# Patient Record
Sex: Male | Born: 1970 | Race: White | Hispanic: No | Marital: Married | Smoking: Former smoker
Health system: Southern US, Community
[De-identification: ages and names within clinical notes are randomized; demographics above are authoritative.]

## PROBLEM LIST (undated history)

## (undated) DIAGNOSIS — I10 Essential (primary) hypertension: Secondary | ICD-10-CM

## (undated) DIAGNOSIS — F419 Anxiety disorder, unspecified: Secondary | ICD-10-CM

## (undated) DIAGNOSIS — E785 Hyperlipidemia, unspecified: Secondary | ICD-10-CM

## (undated) DIAGNOSIS — K219 Gastro-esophageal reflux disease without esophagitis: Secondary | ICD-10-CM

## (undated) HISTORY — DX: Gastro-esophageal reflux disease without esophagitis: K21.9

## (undated) HISTORY — PX: KNEE ARTHROSCOPY: SHX127

## (undated) HISTORY — DX: Essential (primary) hypertension: I10

## (undated) HISTORY — DX: Anxiety disorder, unspecified: F41.9

## (undated) HISTORY — DX: Hyperlipidemia, unspecified: E78.5

---

## 2016-10-29 DIAGNOSIS — R079 Chest pain, unspecified: Secondary | ICD-10-CM | POA: Diagnosis not present

## 2016-10-29 DIAGNOSIS — K219 Gastro-esophageal reflux disease without esophagitis: Secondary | ICD-10-CM | POA: Diagnosis not present

## 2016-10-30 DIAGNOSIS — K219 Gastro-esophageal reflux disease without esophagitis: Secondary | ICD-10-CM | POA: Diagnosis not present

## 2016-10-30 DIAGNOSIS — R079 Chest pain, unspecified: Secondary | ICD-10-CM | POA: Diagnosis not present

## 2019-04-01 DIAGNOSIS — Z01818 Encounter for other preprocedural examination: Secondary | ICD-10-CM

## 2019-09-22 DIAGNOSIS — R079 Chest pain, unspecified: Secondary | ICD-10-CM

## 2019-09-22 DIAGNOSIS — I10 Essential (primary) hypertension: Secondary | ICD-10-CM

## 2019-09-24 ENCOUNTER — Encounter: Payer: Self-pay | Admitting: Cardiology

## 2019-09-28 ENCOUNTER — Encounter: Payer: Self-pay | Admitting: Cardiology

## 2019-09-28 NOTE — Progress Notes (Signed)
Cardiology Office Note:    Date:  09/29/2019   ID:  Benjamin Ion Housholder Sr., DOB [birth year], MRN [id number]  PCP:  [place]  Cardiologist:  Shirlee More, MD   Referring MD: No ref. provider found  ASSESSMENT:    1. Chest pain of uncertain etiology   2. Essential hypertension   3. Right bundle branch block    PLAN:    In order of problems listed above:  1. Atypical symptoms but increased cardiovascular risk cardiac CTA is ordered. 2. Poorly controlled switch to a more potent ARB thiazide combination 3. Hyperlipidemia stable continue statin 4. Stable EKG pattern hospital records  Next appointment 6 weeks   Medication Adjustments/Labs and Tests Ordered: Current medicines are reviewed at length with the patient today.  Concerns regarding medicines are outlined above.  No orders of the defined types were placed in this encounter.  No orders of the defined types were placed in this encounter.    Chief Complaint  Patient presents with  . Hypertension  . Chest Pain    History of Present Illness:    Benjamin Schlarb Moening Sr. is a 49 y.o. male who is being seen today for the evaluation of high blood pressure at the request of [place] ED provider Dr. Lanny Hurst he has a background history of hypertension and no known heart disease.  Other medical problems include hyperlipidemia gastroesophageal reflux disease anxiety and GERD.  The ED record his CBC was normal hemoglobin 15.5 creatinine 0.7 normal GFR potassium 3.7.  The EKG showed sinus rhythm was felt to be normal and a chest x-ray was normal.  He was discharged from the emergency room on low-dose losartan 25 mg daily.  He was admitted to [facility] on 09/22/19 discharged within 24 hours with chest pain  [name] NP is his primary care physician.  His EKG did not show acute ischemia and his troponins were normal and the patient declined having an ischemia evaluation and did not have a myocardial  perfusion study.  He had 3 troponins that were all undetectable.  In the emergency room at [facility] on 08/22/2018 with chest pain months felt to be low risk with a normal troponin discharged. I independently reviewed the EKG 09/22/2019 09/24/2019 both show sinus rhythm incomplete right bundle branch block left atrial enlargement no ischemic changes  I have cared for multiple family members has a family history of premature CAD.  He smokes has hyperlipidemia on a statin and hypertension also.  He has had knee pain degenerative joint disease and associated with steroids at the onset of hyper tension he has had several ED visits.  He has had chest discomfort but it is nonexertional is associated with anxiety and often relieved with belching.  He has mild exertional shortness of breath he attributes to weight gain 20 pounds cigarette smoking and sedentary lifestyle and wants to start an exercise program.  He also has fatigue was diagnosed with testosterone deficiency but has not started supplements.  His blood pressures are erratic ranging from 120-130/70-80 to 170/100 and checks blood pressure variable times of the day.  Further evaluation will undergo a cardiac CTA to define his calcium score presence or absence CAD and if he has high risk anatomy would benefit from coronary angiography and revascularization.  I strongly encouraged him to stop smoking.  I encouraged him to begin an exercise program he has equipment he has never used at home treadmill elliptical and to undergo cardiac CTA for  risk assessment.  His hypertension is poorly controlled we will switch to a more potent ARB thiazide combination check at rest record and follow-up with me in 6 weeks.  Continue his lipid-lowering therapy. Past Medical History:  Diagnosis Date  . Anxiety   . GERD (gastroesophageal reflux disease)   . Hyperlipidemia   . Hypertension     History reviewed. No pertinent surgical history.  Current  Medications: Current Meds  Medication Sig  . atorvastatin (LIPITOR) 40 MG tablet Take 40 mg by mouth daily.  . montelukast (SINGULAIR) 10 MG tablet Take 10 mg by mouth daily.  . pantoprazole (PROTONIX) 40 MG tablet Take 40 mg by mouth daily.  . [DISCONTINUED] losartan (COZAAR) 50 MG tablet Take 50 mg by mouth daily.     Allergies:   Patient has no allergy information on record.   Social History   Socioeconomic History  . Marital status: Married    Spouse name: Not on file  . Number of children: Not on file  . Years of education: Not on file  . Highest education level: Not on file  Occupational History  . Not on file  Tobacco Use  . Smoking status: Current Every Day Smoker  . Smokeless tobacco: Never Used  Substance and Sexual Activity  . Alcohol use: Not on file  . Drug use: Not on file  . Sexual activity: Not on file  Other Topics Concern  . Not on file  Social History Narrative  . Not on file   Social Determinants of Health   Financial Resource Strain:   . Difficulty of Paying Living Expenses:   Food Insecurity:   . Worried About Programme researcher, broadcasting/film/video in the Last Year:   . Barista in the Last Year:   Transportation Needs:   . Freight forwarder (Medical):   Marland Kitchen Lack of Transportation (Non-Medical):   Physical Activity:   . Days of Exercise per Week:   . Minutes of Exercise per Session:   Stress:   . Feeling of Stress :   Social Connections:   . Frequency of Communication with Friends and Family:   . Frequency of Social Gatherings with Friends and Family:   . Attends Religious Services:   . Active Member of Clubs or Organizations:   . Attends Banker Meetings:   Marland Kitchen Marital Status:      Family History: The patient's family history is not on file.  ROS:   Review of Systems  Constitution: Positive for weight gain.  HENT: Negative.   Eyes: Negative.   Cardiovascular: Positive for chest pain and dyspnea on exertion.  Respiratory:  Positive for shortness of breath.   Endocrine: Negative.   Hematologic/Lymphatic: Negative.   Skin: Negative.   Musculoskeletal: Positive for joint pain.  Gastrointestinal: Positive for bloating.  Genitourinary: Negative.   Neurological: Negative.   Psychiatric/Behavioral: Negative.   Allergic/Immunologic: Negative.    Please see the history of present illness.     All other systems reviewed and are negative.  EKGs/Labs/Other Studies Reviewed:    The following studies were reviewed today:   EKG:  EKG is  ordered today.  The ekg ordered today is personally reviewed and demonstrates sinus rhythm normal    Physical Exam:    VS:  BP 124/70   Pulse 74   Ht 5\' 10"  (1.778 m)   Wt 235 lb (106.6 kg)   SpO2 97%   BMI 33.72 kg/m     Wt Readings  from Last 3 Encounters:  09/29/19 235 lb (106.6 kg)     GEN:  Well nourished, well developed in no acute distress HEENT: Normal NECK: No JVD; No carotid bruits LYMPHATICS: No lymphadenopathy CARDIAC: RRR, no murmurs, rubs, gallops RESPIRATORY:  Clear to auscultation without rales, wheezing or rhonchi  ABDOMEN: Soft, non-tender, non-distended MUSCULOSKELETAL:  No edema; No deformity  SKIN: Warm and dry NEUROLOGIC:  Alert and oriented x 3 PSYCHIATRIC:  Normal affect     Signed, Norman Herrlich, MD  09/29/2019 3:27 PM    [facility]

## 2019-09-29 ENCOUNTER — Ambulatory Visit (INDEPENDENT_AMBULATORY_CARE_PROVIDER_SITE_OTHER): Payer: Self-pay | Admitting: Cardiology

## 2019-09-29 ENCOUNTER — Other Ambulatory Visit: Payer: Self-pay

## 2019-09-29 ENCOUNTER — Encounter: Payer: Self-pay | Admitting: Cardiology

## 2019-09-29 VITALS — BP 124/70 | HR 74 | Ht 70.0 in | Wt 235.0 lb

## 2019-09-29 DIAGNOSIS — R079 Chest pain, unspecified: Secondary | ICD-10-CM

## 2019-09-29 DIAGNOSIS — I10 Essential (primary) hypertension: Secondary | ICD-10-CM

## 2019-09-29 DIAGNOSIS — R072 Precordial pain: Secondary | ICD-10-CM

## 2019-09-29 DIAGNOSIS — I4510 Unspecified right bundle-branch block: Secondary | ICD-10-CM

## 2019-09-29 MED ORDER — METOPROLOL TARTRATE 100 MG PO TABS: 100.0000 mg | ORAL_TABLET | Freq: Once | ORAL | 0 refills | Status: DC

## 2019-09-29 MED ORDER — TELMISARTAN-HCTZ 40-12.5 MG PO TABS: 1.0000 | ORAL_TABLET | Freq: Every day | ORAL | 3 refills | Status: DC

## 2019-09-29 NOTE — Patient Instructions (Addendum)
Medication Instructions:  Your physician has recommended you make the following change in your medication:  STOP Losartan START Telmisartan-HCTZ 40-12.5 mg take one tablet by mouth daily. *If you need a refill on your cardiac medications before your next appointment, please call your pharmacy*   Lab Work: Your physician recommends that you return for lab work in: [place] CT BMP If you have labs (blood work) drawn today and your tests are completely normal, you will receive your results only by: Marland Kitchen MyChart Message (if you have MyChart) OR . A paper copy in the mail If you have any lab test that is abnormal or we need to change your treatment, we will call you to review the results.   Testing/Procedures: Your physician has requested that you have cardiac CT. Cardiac computed tomography (CT) is a painless test that uses an x-ray machine to take clear, detailed pictures of your heart. For further information please visit [email or web address]. Please follow instruction sheet as given.  Your cardiac CT will be scheduled at one of the below locations:   [facility] [address] [place], [place] [address] [phone]   If scheduled at [facility], please arrive at the [facility] main entrance of [facility] 30 minutes prior to test start time. Proceed to the [facility] Radiology Department (first floor) to check-in and test prep.  Please follow these instructions carefully (unless otherwise directed):  On the Night Before the Test: . Be sure to Drink plenty of water. . Do not consume any caffeinated/decaffeinated beverages or chocolate 12 hours prior to your test. . Do not take any antihistamines 12 hours prior to your test.   On the Day of the Test: . Drink plenty of water. Do not drink any water within one hour of the test. . Do not eat any food 4 hours prior to the test. . You may take your regular medications prior to the test.  . Take  metoprolol (Lopressor) two hours prior to test. . HOLD Hydrochlorothiazide morning of the test.              After the Test: . Drink plenty of water. . After receiving IV contrast, you may experience a mild flushed feeling. This is normal. . On occasion, you may experience a mild rash up to 24 hours after the test. This is not dangerous. If this occurs, you can take Benadryl 25 mg and increase your fluid intake. . If you experience trouble breathing, this can be serious. If it is severe call 911 IMMEDIATELY. If it is mild, please call our office.  Once we have confirmed authorization from your insurance company, we will call you to set up a date and time for your test.   For non-scheduling related questions, please contact the cardiac imaging nurse navigator should you have any questions/concerns: Marchia Bond, RN Navigator Cardiac Imaging [facility] [phone] office  For scheduling needs, including cancellations and rescheduling, please call [phone].      Follow-Up: At [facility], you and your health needs are our priority.  As part of our continuing mission to provide you with exceptional heart care, we have created designated Provider Care Teams.  These Care Teams include your primary Cardiologist (physician) and Advanced Practice Providers (APPs -  Physician Assistants and Nurse Practitioners) who all work together to provide you with the care you need, when you need it.  We recommend signing up for the patient portal called "  MyChart".  Sign up information is provided on this After Visit Summary.  MyChart is used to connect with patients for Virtual Visits (Telemedicine).  Patients are able to view lab/test results, encounter notes, upcoming appointments, etc.  Non-urgent messages can be sent to your provider as well.   To learn more about what you can do with MyChart, go to [email or web address].    Your next appointment:   6  week(s)  The format for your next appointment:   In Person  Provider:   Norman Herrlich, MD   Other Instructions

## 2019-10-26 ENCOUNTER — Telehealth (HOSPITAL_COMMUNITY): Payer: Self-pay | Admitting: Emergency Medicine

## 2019-10-26 ENCOUNTER — Other Ambulatory Visit: Payer: Self-pay

## 2019-10-26 DIAGNOSIS — R079 Chest pain, unspecified: Secondary | ICD-10-CM

## 2019-10-26 DIAGNOSIS — I10 Essential (primary) hypertension: Secondary | ICD-10-CM

## 2019-10-26 NOTE — Telephone Encounter (Signed)
Reaching out to patient to offer assistance regarding upcoming cardiac imaging study; pt verbalizes understanding of appt date/time, parking situation and where to check in, pre-test NPO status and medications ordered, and verified current allergies; name and call back number provided for further questions should they arise Gresia Isidoro RN Navigator Cardiac Imaging [facility] [phone] office [phone] cell 

## 2019-10-27 ENCOUNTER — Ambulatory Visit (HOSPITAL_COMMUNITY)
Admission: RE | Admit: 2019-10-27 | Discharge: 2019-10-27 | Disposition: A | Discharge status: Home / Self Care | Payer: Self-pay | Source: Ambulatory Visit | Attending: Cardiology | Admitting: Cardiology

## 2019-10-27 ENCOUNTER — Encounter: Payer: Self-pay | Admitting: *Deleted

## 2019-10-27 ENCOUNTER — Other Ambulatory Visit: Payer: Self-pay

## 2019-10-27 DIAGNOSIS — Z006 Encounter for examination for normal comparison and control in clinical research program: Secondary | ICD-10-CM

## 2019-10-27 DIAGNOSIS — R072 Precordial pain: Secondary | ICD-10-CM

## 2019-10-27 LAB — BASIC METABOLIC PANEL
BUN/Creatinine Ratio: 22 — ABNORMAL HIGH (ref 9–20)
BUN: 20 mg/dL (ref 6–24)
CO2: 22 mmol/L (ref 20–29)
Calcium: 9.6 mg/dL (ref 8.7–10.2)
Chloride: 99 mmol/L (ref 96–106)
Creatinine, Ser: 0.93 mg/dL (ref 0.76–1.27)
GFR calc Af Amer: 111 mL/min/{1.73_m2} (ref >59)
GFR calc non Af Amer: 96 mL/min/{1.73_m2} (ref >59)
Glucose: 129 mg/dL — ABNORMAL HIGH (ref 65–99)
Potassium: 3.8 mmol/L (ref 3.5–5.2)
Sodium: 138 mmol/L (ref 134–144)

## 2019-10-27 MED ORDER — NITROGLYCERIN 0.4 MG SL SUBL
SUBLINGUAL_TABLET | SUBLINGUAL | Status: AC
  Administered 2019-10-27: 17:00:00 0.8 mg via SUBLINGUAL
  Filled 2019-10-27: qty 2

## 2019-10-27 MED ORDER — IOHEXOL 350 MG/ML SOLN
100.0000 mL | Freq: Once | INTRAVENOUS | Status: AC | PRN
  Administered 2019-10-27: 100 mL via INTRAVENOUS

## 2019-10-27 MED ORDER — NITROGLYCERIN 0.4 MG SL SUBL: 0.8000 mg | SUBLINGUAL_TABLET | Freq: Once | SUBLINGUAL | Status: AC

## 2019-10-27 NOTE — Research (Signed)
CADFEM Informed Consent                  Subject Name:   Benjamin Douglas   Subject met inclusion and exclusion criteria.  The informed consent form, study requirements and expectations were reviewed with the subject and questions and concerns were addressed prior to the signing of the consent form.  The subject verbalized understanding of the trial requirements.  The subject agreed to participate in the CADFEM trial and signed the informed consent.  The informed consent was obtained prior to performance of any protocol-specific procedures for the subject.  A copy of the signed informed consent was given to the subject and a copy was placed in the subject's medical record.   [place] Kadelyn Dimascio, Research Assistant   10/27/2019  15:20

## 2019-10-31 ENCOUNTER — Telehealth: Payer: Self-pay

## 2019-10-31 NOTE — Telephone Encounter (Signed)
Spoke with patient regarding results and recommendation.  Patient verbalizes understanding and is agreeable to plan of care. Advised patient to call back with any issues or concerns.  

## 2019-10-31 NOTE — Telephone Encounter (Signed)
-----   Message from Baldo Daub, MD sent at 10/29/2019  4:23 PM EDT ----- Normal or stable result   Overall good result, his calcium score a measure of total atherosclerosis was quite low and he had very mild atherosclerosis in the coronary arteries with no concerning blockage.  Please be sure he has a follow-up appointment to see me in the office after this.

## 2019-11-09 DIAGNOSIS — Z720 Tobacco use: Secondary | ICD-10-CM

## 2019-11-09 DIAGNOSIS — K219 Gastro-esophageal reflux disease without esophagitis: Secondary | ICD-10-CM | POA: Insufficient documentation

## 2019-11-09 DIAGNOSIS — I249 Acute ischemic heart disease, unspecified: Secondary | ICD-10-CM

## 2019-11-09 DIAGNOSIS — R079 Chest pain, unspecified: Secondary | ICD-10-CM

## 2019-11-09 DIAGNOSIS — I10 Essential (primary) hypertension: Secondary | ICD-10-CM

## 2019-11-09 HISTORY — DX: Essential (primary) hypertension: I10

## 2019-11-09 HISTORY — DX: Chest pain, unspecified: R07.9

## 2019-11-09 HISTORY — DX: Acute ischemic heart disease, unspecified: I24.9

## 2019-11-09 HISTORY — DX: Tobacco use: Z72.0

## 2019-11-09 NOTE — Progress Notes (Signed)
Cardiology Office Note:    Date:  11/10/2019   ID:  [name], DOB 1970/07/22, MRN [id number]  PCP:  [place]  Cardiologist:  Shirlee More, MD    Referring MD: [facility]*    ASSESSMENT:    1. Mild CAD   2. Hyperlipidemia, unspecified hyperlipidemia type   3. Essential hypertension   4. Right bundle branch block    PLAN:    In order of problems listed above:  1. CT reviewed with patient he has coronary atherosclerosis less than 24% stenosis LAD medical therapy aspirin antihypertensive intensity statin check his lipid profile goal of LDL is less than 70 ideally less than 50 may require the addition of Zetia.  We will also screen for LP(a) access today. 2. Continue statin 3. BP at target continue his current antihypertensive 4. Stable EKG pattern   Next appointment: 6 months   Medication Adjustments/Labs and Tests Ordered: Current medicines are reviewed at length with the patient today.  Concerns regarding medicines are outlined above.  Orders Placed This Encounter  Procedures  . Lipid Profile  . Lipoprotein A (LPA)  . Comp Met (CMET)  . EKG 12-Lead   Meds ordered this encounter  Medications  . tadalafil (CIALIS) 5 MG tablet    Sig: Take 1 tablet (5 mg total) by mouth daily as needed for erectile dysfunction.    Dispense:  10 tablet    Refill:  0    No chief complaint on file.   History of Present Illness:    Benjamin Jayson Boyd Sr. is a 49 y.o. male with a hx of chest pain, family history premature CAD, RBBB and hypertension last seen 09/29/2019. Compliance with diet, lifestyle and medications: Yes  Reviewed and reviewed his cardiac CTA with calcium score as well as 67 percentile as well as mild nonobstructive CAD.  With his family history consistent with coronary his attention he is on a statin asked me to check a lipid profile will check LP(a) with a strong family history BP is at target and he is having no angina.   Inquires about testosterone therapy: Lipids are good idea and when asked about erectile dysfunction I think it safe for him to use Cialis and gave a prescription.  He is having no muscle pain or weakness from statin his blood pressure at home is at target and he said no further chest pain.  He is quite motivated about diet exercise and weight loss.  Cardiac CTA 10/27/2019: IMPRESSION: 1. Calcium score 5 which is 71 th percentile for age and sex 2.  Normal aortic root diameter 3.1 cm 3.  CAD RADS 1 non obstructive CAD  Past Medical History:  Diagnosis Date  . Acute coronary syndrome ([place]) 11/09/2019  . Anxiety   . Chest pain 11/09/2019  . GERD (gastroesophageal reflux disease)   . Hyperlipidemia   . Hypertension   . Hypertension, essential 11/09/2019  . Tobacco abuse 11/09/2019    History reviewed. No pertinent surgical history.  Current Medications: Current Meds  Medication Sig  . atorvastatin (LIPITOR) 40 MG tablet Take 40 mg by mouth daily.  . montelukast (SINGULAIR) 10 MG tablet Take 10 mg by mouth daily.  . pantoprazole (PROTONIX) 40 MG tablet Take 40 mg by mouth daily.  Marland Kitchen telmisartan-hydrochlorothiazide (MICARDIS HCT) 40-12.5 MG tablet Take 1 tablet by mouth daily.     Allergies:   Patient has no allergy information on record.   Social History   Socioeconomic  History  . Marital status: Married    Spouse name: Not on file  . Number of children: Not on file  . Years of education: Not on file  . Highest education level: Not on file  Occupational History  . Not on file  Tobacco Use  . Smoking status: Current Every Day Smoker  . Smokeless tobacco: Never Used  Substance and Sexual Activity  . Alcohol use: Not on file  . Drug use: Not on file  . Sexual activity: Not on file  Other Topics Concern  . Not on file  Social History Narrative  . Not on file   Social Determinants of Health   Financial Resource Strain:   . Difficulty of Paying Living Expenses:   Food  Insecurity:   . Worried About Charity fundraiser in the Last Year:   . Arboriculturist in the Last Year:   Transportation Needs:   . Film/video editor (Medical):   Marland Kitchen Lack of Transportation (Non-Medical):   Physical Activity:   . Days of Exercise per Week:   . Minutes of Exercise per Session:   Stress:   . Feeling of Stress :   Social Connections:   . Frequency of Communication with Friends and Family:   . Frequency of Social Gatherings with Friends and Family:   . Attends Religious Services:   . Active Member of Clubs or Organizations:   . Attends Archivist Meetings:   Marland Kitchen Marital Status:      Family History: The patient's family history is not on file. ROS:   Please see the history of present illness.    All other systems reviewed and are negative.  EKGs/Labs/Other Studies Reviewed:    The following studies were reviewed today:  EKG:  EKG ordered today and personally reviewed.  The ekg ordered today demonstrates sinus rhythm and remains normal except incomplete right bundle branch block  Recent Labs: 10/26/2019: BUN 20; Creatinine, Ser 0.93; Potassium 3.8; Sodium 138  Recent Lipid Panel No results found for: CHOL, TRIG, HDL, CHOLHDL, VLDL, LDLCALC, LDLDIRECT  Physical Exam:    VS:  BP 124/76   Pulse 77   Temp 98.6 F (37 C)   Ht 5' 10"  (1.778 m)   Wt 233 lb 3.2 oz (105.8 kg)   SpO2 97%   BMI 33.46 kg/m     Wt Readings from Last 3 Encounters:  11/10/19 233 lb 3.2 oz (105.8 kg)  09/29/19 235 lb (106.6 kg)     GEN:  Well nourished, well developed in no acute distress he has no xanthoma or xanthelasma HEENT: Normal NECK: No JVD; No carotid bruits LYMPHATICS: No lymphadenopathy CARDIAC: RRR, no murmurs, rubs, gallops RESPIRATORY:  Clear to auscultation without rales, wheezing or rhonchi  ABDOMEN: Soft, non-tender, non-distended MUSCULOSKELETAL:  No edema; No deformity  SKIN: Warm and dry NEUROLOGIC:  Alert and oriented x 3 PSYCHIATRIC:  Normal  affect    Signed, Shirlee More, MD  11/10/2019 3:19 PM    [facility]

## 2019-11-10 ENCOUNTER — Other Ambulatory Visit: Payer: Self-pay

## 2019-11-10 ENCOUNTER — Encounter: Payer: Self-pay | Admitting: Cardiology

## 2019-11-10 ENCOUNTER — Ambulatory Visit (INDEPENDENT_AMBULATORY_CARE_PROVIDER_SITE_OTHER): Payer: Self-pay | Admitting: Cardiology

## 2019-11-10 VITALS — BP 124/76 | HR 77 | Temp 98.6°F | Ht 70.0 in | Wt 233.2 lb

## 2019-11-10 DIAGNOSIS — I2510 Atherosclerotic heart disease of native coronary artery without angina pectoris: Secondary | ICD-10-CM

## 2019-11-10 DIAGNOSIS — I10 Essential (primary) hypertension: Secondary | ICD-10-CM

## 2019-11-10 DIAGNOSIS — E785 Hyperlipidemia, unspecified: Secondary | ICD-10-CM

## 2019-11-10 DIAGNOSIS — I4510 Unspecified right bundle-branch block: Secondary | ICD-10-CM

## 2019-11-10 MED ORDER — TADALAFIL 5 MG PO TABS: 5.0000 mg | ORAL_TABLET | Freq: Every day | ORAL | 0 refills | Status: DC | PRN

## 2019-11-10 NOTE — Patient Instructions (Signed)
Medication Instructions:  Your physician has recommended you make the following change in your medication:  START: Cialis 5 mg take one tablet by mouth daily as needed.  *If you need a refill on your cardiac medications before your next appointment, please call your pharmacy*   Lab Work: Your physician recommends that you return for lab work in: TODAY Lipids, Lpa, CMP If you have labs (blood work) drawn today and your tests are completely normal, you will receive your results only by: Marland Kitchen MyChart Message (if you have MyChart) OR . A paper copy in the mail If you have any lab test that is abnormal or we need to change your treatment, we will call you to review the results.   Testing/Procedures: None   Follow-Up: At [facility], you and your health needs are our priority.  As part of our continuing mission to provide you with exceptional heart care, we have created designated Provider Care Teams.  These Care Teams include your primary Cardiologist (physician) and Advanced Practice Providers (APPs -  Physician Assistants and Nurse Practitioners) who all work together to provide you with the care you need, when you need it.  We recommend signing up for the patient portal called "MyChart".  Sign up information is provided on this After Visit Summary.  MyChart is used to connect with patients for Virtual Visits (Telemedicine).  Patients are able to view lab/test results, encounter notes, upcoming appointments, etc.  Non-urgent messages can be sent to your provider as well.   To learn more about what you can do with MyChart, go to [email or web address].    Your next appointment:   6 month(s)  The format for your next appointment:   In Person  Provider:   Norman Herrlich, MD   Other Instructions

## 2019-11-14 LAB — COMPREHENSIVE METABOLIC PANEL
ALT: 29 IU/L (ref 0–44)
AST: 24 IU/L (ref 0–40)
Albumin/Globulin Ratio: 2.4 — ABNORMAL HIGH (ref 1.2–2.2)
Albumin: 4.7 g/dL (ref 4.0–5.0)
Alkaline Phosphatase: 116 IU/L (ref 39–117)
BUN/Creatinine Ratio: 17 (ref 9–20)
BUN: 16 mg/dL (ref 6–24)
Bilirubin Total: 0.4 mg/dL (ref 0.0–1.2)
CO2: 23 mmol/L (ref 20–29)
Calcium: 9 mg/dL (ref 8.7–10.2)
Chloride: 104 mmol/L (ref 96–106)
Creatinine, Ser: 0.93 mg/dL (ref 0.76–1.27)
GFR calc Af Amer: 111 mL/min/{1.73_m2} (ref >59)
GFR calc non Af Amer: 96 mL/min/{1.73_m2} (ref >59)
Globulin, Total: 2 g/dL (ref 1.5–4.5)
Glucose: 120 mg/dL — ABNORMAL HIGH (ref 65–99)
Potassium: 4 mmol/L (ref 3.5–5.2)
Sodium: 140 mmol/L (ref 134–144)
Total Protein: 6.7 g/dL (ref 6.0–8.5)

## 2019-11-14 LAB — LIPOPROTEIN A (LPA): Lipoprotein (a): 10 nmol/L (ref <75.0)

## 2019-11-14 LAB — LIPID PANEL
Chol/HDL Ratio: 5.9 ratio — ABNORMAL HIGH (ref 0.0–5.0)
Cholesterol, Total: 182 mg/dL (ref 100–199)
HDL: 31 mg/dL — ABNORMAL LOW (ref >39)
LDL Chol Calc (NIH): 97 mg/dL (ref 0–99)
Triglycerides: 319 mg/dL — ABNORMAL HIGH (ref 0–149)
VLDL Cholesterol Cal: 54 mg/dL — ABNORMAL HIGH (ref 5–40)

## 2019-11-15 ENCOUNTER — Telehealth: Payer: Self-pay

## 2019-11-15 MED ORDER — EZETIMIBE 10 MG PO TABS: 10.0000 mg | ORAL_TABLET | Freq: Every day | ORAL | 3 refills | Status: DC

## 2019-11-15 NOTE — Telephone Encounter (Signed)
Spoke with patient regarding results and recommendation.  Patient verbalizes understanding and is agreeable to plan of care. Advised patient to call back with any issues or concerns.  

## 2019-11-15 NOTE — Telephone Encounter (Signed)
-----   Message from Thomasene Ripple, DO sent at 11/14/2019 10:30 PM EDT ----- Lipid profile showed dyslipidemia.  I see he is on Lipitor 40 mg daily.  Would like to add Zetia 10 mg daily to his regimen.  Also encouraged the patient to decrease carbs in diet.  We will continue to monitor repeat blood work as appropriate

## 2020-01-27 ENCOUNTER — Other Ambulatory Visit: Payer: Self-pay | Admitting: Cardiology

## 2020-02-22 ENCOUNTER — Telehealth: Payer: Self-pay | Admitting: Cardiology

## 2020-02-22 ENCOUNTER — Other Ambulatory Visit: Payer: Self-pay

## 2020-02-22 DIAGNOSIS — Z79899 Other long term (current) drug therapy: Secondary | ICD-10-CM

## 2020-02-22 NOTE — Progress Notes (Signed)
Generally I prefer not to would direct him to his PCP

## 2020-02-22 NOTE — Telephone Encounter (Signed)
The week before his visit with me he should have a CMP and a lipid profile performed  I reviewed his chart I think he could take Viagra but has to understand he cannot take nitroglycerin within 24 hours of taking a tablet.

## 2020-02-22 NOTE — Progress Notes (Signed)
Baldo Daub, MD     02/22/20 2:05 PM Note The week before his visit with me he should have a CMP and a lipid profile performed  I reviewed his chart I think he could take Viagra but has to understand he cannot take nitroglycerin within 24 hours of taking a tablet.     Called the patient and made him aware of the above recommendations from Dr. Dulce Sellar. Lab orders placed. Patient is now asking if Dr. Dulce Sellar will send in a Rx for the requested Viagra. I let the patient know that I would check with Dr. Dulce Sellar to see if he would like to prescribe.

## 2020-02-22 NOTE — Telephone Encounter (Signed)
Baldo Daub, MD    02/22/20 2:05 PM Note The week before his visit with me he should have a CMP and a lipid profile performed  I reviewed his chart I think he could take Viagra but has to understand he cannot take nitroglycerin within 24 hours of taking a tablet.     Called the patient and made him aware of the above recommendations from Dr. Dulce Sellar. Lab orders placed. Patient is now asking if Dr. Dulce Sellar will send in a Rx for the requested Viagra. I let the patient know that I would check with Dr. Dulce Sellar to see if he would like to prescribe.   Called the patient back to let him know that Dr. Dulce Sellar prefers that patient reach out to his PCP to request the Viagra Rx as he does not typically prescribe those.

## 2020-02-22 NOTE — Telephone Encounter (Signed)
    Pt c/o medication issue:  1. Name of Medication: Viagra  2. How are you currently taking this medication (dosage and times per day)?   3. Are you having a reaction (difficulty breathing--STAT)?   4. What is your medication issue? Pt's wife called, she said pt wants to talk to Dr. Dulce Sellar about taking Viagra. She said to call opt back.  She also wanted to ask if Dr. Dulce Sellar wants pt to get blood work before seeing him in November

## 2020-05-08 ENCOUNTER — Ambulatory Visit: Payer: Self-pay | Admitting: Cardiology

## 2020-05-17 NOTE — Progress Notes (Signed)
Cardiology Office Note:    Date:  05/18/2020   ID:  Benjamin Goo Maves Sr., DOB April 17, 1971, MRN [id number]  PCP:  [facility]  Cardiologist:  Benjamin Herrlich, MD    Referring MD: [facility]    ASSESSMENT:    1. Hypertension, essential   2. Mild CAD   3. Hyperlipidemia, unspecified hyperlipidemia type    PLAN:    In order of problems listed above:  1. Benjamin Douglas is doing well blood pressure at target continue his combination ARB thiazide diuretic well-tolerated check renal function and serum potassium I encouraged him to check his blood pressure at least weekly at home at rest and document 2. Stable asymptomatic having no angina present treatment continue his cholesterol and blood pressure medicines 3. Recheck lipids if not at target consider bempedoic acid or PCSK9 agent   Next appointment: 6 months   Medication Adjustments/Labs and Tests Ordered: Current medicines are reviewed at length with the patient today.  Concerns regarding medicines are outlined above.  Orders Placed This Encounter  Procedures  . Comprehensive metabolic panel  . Lipid panel   Meds ordered this encounter  Medications  . telmisartan-hydrochlorothiazide (MICARDIS HCT) 40-12.5 MG tablet    Sig: TAKE ONE (1) TABLET BY MOUTH ONCE DAILY    Dispense:  90 tablet    Refill:  3  . ezetimibe (ZETIA) 10 MG tablet    Sig: Take 1 tablet (10 mg total) by mouth daily.    Dispense:  90 tablet    Refill:  3  . atorvastatin (LIPITOR) 40 MG tablet    Sig: Take 1 tablet (40 mg total) by mouth daily.    Dispense:  90 tablet    Refill:  3    No chief complaint on file.   History of Present Illness:    Benjamin Goyne Skora Sr. is a 49 y.o. male with a hx of mild CAD right bundle branch block hypertension hyperlipidemia last seen 11/10/2019. Compliance with diet, lifestyle and medications: Yes  He is doing better was intolerant of statin with muscle and joint pain tolerates Zetia  check lipid profile.  Home blood pressures run in the range of 130-135/80.  He is having no angina dyspnea palpitation or syncope and is committed to taking care of himself Past Medical History:  Diagnosis Date  . Acute coronary syndrome (HCC) 11/09/2019  . Anxiety   . Chest pain 11/09/2019  . GERD (gastroesophageal reflux disease)   . Hyperlipidemia   . Hypertension   . Hypertension, essential 11/09/2019  . Tobacco abuse 11/09/2019    History reviewed. No pertinent surgical history.  Current Medications: Current Meds  Medication Sig  . atorvastatin (LIPITOR) 40 MG tablet Take 1 tablet (40 mg total) by mouth daily.  Marland Kitchen ezetimibe (ZETIA) 10 MG tablet Take 1 tablet (10 mg total) by mouth daily.  . montelukast (SINGULAIR) 10 MG tablet Take 10 mg by mouth daily.  . pantoprazole (PROTONIX) 40 MG tablet Take 40 mg by mouth daily.  . tadalafil (CIALIS) 5 MG tablet Take 1 tablet (5 mg total) by mouth daily as needed for erectile dysfunction.  Marland Kitchen telmisartan-hydrochlorothiazide (MICARDIS HCT) 40-12.5 MG tablet TAKE ONE (1) TABLET BY MOUTH ONCE DAILY  . [DISCONTINUED] atorvastatin (LIPITOR) 40 MG tablet Take 40 mg by mouth daily.  . [DISCONTINUED] ezetimibe (ZETIA) 10 MG tablet Take 1 tablet (10 mg total) by mouth daily.  . [DISCONTINUED] telmisartan-hydrochlorothiazide (MICARDIS HCT) 40-12.5 MG tablet TAKE ONE (1) TABLET BY MOUTH ONCE  DAILY     Allergies:   Patient has no known allergies.   Social History   Socioeconomic History  . Marital status: Married    Spouse name: Not on file  . Number of children: Not on file  . Years of education: Not on file  . Highest education level: Not on file  Occupational History  . Not on file  Tobacco Use  . Smoking status: Current Every Day Smoker  . Smokeless tobacco: Never Used  Substance and Sexual Activity  . Alcohol use: Not on file  . Drug use: Not on file  . Sexual activity: Not on file  Other Topics Concern  . Not on file  Social History  Narrative  . Not on file   Social Determinants of Health   Financial Resource Strain:   . Difficulty of Paying Living Expenses: Not on file  Food Insecurity:   . Worried About Programme researcher, broadcasting/film/video in the Last Year: Not on file  . Ran Out of Food in the Last Year: Not on file  Transportation Needs:   . Lack of Transportation (Medical): Not on file  . Lack of Transportation (Non-Medical): Not on file  Physical Activity:   . Days of Exercise per Week: Not on file  . Minutes of Exercise per Session: Not on file  Stress:   . Feeling of Stress : Not on file  Social Connections:   . Frequency of Communication with Friends and Family: Not on file  . Frequency of Social Gatherings with Friends and Family: Not on file  . Attends Religious Services: Not on file  . Active Member of Clubs or Organizations: Not on file  . Attends Banker Meetings: Not on file  . Marital Status: Not on file     Family History: The patient's family history is not on file. ROS:   Please see the history of present illness.    All other systems reviewed and are negative.  EKGs/Labs/Other Studies Reviewed:    The following studies were reviewed today:   Recent Labs: 11/10/2019: ALT 29; BUN 16; Creatinine, Ser 0.93; Potassium 4.0; Sodium 140  Recent Lipid Panel    Component Value Date/Time   CHOL 182 11/10/2019 1523   TRIG 319 (H) 11/10/2019 1523   HDL 31 (L) 11/10/2019 1523   CHOLHDL 5.9 (H) 11/10/2019 1523   LDLCALC 97 11/10/2019 1523    Physical Exam:    VS:  BP (!) 152/88   Pulse 72   Ht 5\' 11"  (1.803 m)   Wt 238 lb 6.4 oz (108.1 kg)   SpO2 98%   BMI 33.25 kg/m     Wt Readings from Last 3 Encounters:  05/18/20 238 lb 6.4 oz (108.1 kg)  11/10/19 233 lb 3.2 oz (105.8 kg)  09/29/19 235 lb (106.6 kg)     GEN:  Well nourished, well developed in no acute distress HEENT: Normal NECK: No JVD; No carotid bruits LYMPHATICS: No lymphadenopathy CARDIAC: RRR, no murmurs, rubs,  gallops RESPIRATORY:  Clear to auscultation without rales, wheezing or rhonchi  ABDOMEN: Soft, non-tender, non-distended MUSCULOSKELETAL:  No edema; No deformity  SKIN: Warm and dry NEUROLOGIC:  Alert and oriented x 3 PSYCHIATRIC:  Normal affect    Signed, 10/01/19, MD  05/18/2020 11:08 AM    [facility]

## 2020-05-18 ENCOUNTER — Encounter: Payer: Self-pay | Admitting: Cardiology

## 2020-05-18 ENCOUNTER — Ambulatory Visit (INDEPENDENT_AMBULATORY_CARE_PROVIDER_SITE_OTHER): Payer: Self-pay | Admitting: Cardiology

## 2020-05-18 ENCOUNTER — Other Ambulatory Visit: Payer: Self-pay

## 2020-05-18 VITALS — BP 152/88 | HR 72 | Ht 71.0 in | Wt 238.4 lb

## 2020-05-18 DIAGNOSIS — I10 Essential (primary) hypertension: Secondary | ICD-10-CM

## 2020-05-18 DIAGNOSIS — E785 Hyperlipidemia, unspecified: Secondary | ICD-10-CM

## 2020-05-18 DIAGNOSIS — I2510 Atherosclerotic heart disease of native coronary artery without angina pectoris: Secondary | ICD-10-CM

## 2020-05-18 LAB — LIPID PANEL
Chol/HDL Ratio: 4.1 ratio (ref 0.0–5.0)
Cholesterol, Total: 154 mg/dL (ref 100–199)
HDL: 38 mg/dL — ABNORMAL LOW (ref >39)
LDL Chol Calc (NIH): 91 mg/dL (ref 0–99)
Triglycerides: 141 mg/dL (ref 0–149)
VLDL Cholesterol Cal: 25 mg/dL (ref 5–40)

## 2020-05-18 LAB — COMPREHENSIVE METABOLIC PANEL
ALT: 25 IU/L (ref 0–44)
AST: 19 IU/L (ref 0–40)
Albumin/Globulin Ratio: 1.8 (ref 1.2–2.2)
Albumin: 4.5 g/dL (ref 4.0–5.0)
Alkaline Phosphatase: 104 IU/L (ref 44–121)
BUN/Creatinine Ratio: 21 — ABNORMAL HIGH (ref 9–20)
BUN: 16 mg/dL (ref 6–24)
Bilirubin Total: 0.4 mg/dL (ref 0.0–1.2)
CO2: 27 mmol/L (ref 20–29)
Calcium: 9.4 mg/dL (ref 8.7–10.2)
Chloride: 101 mmol/L (ref 96–106)
Creatinine, Ser: 0.75 mg/dL — ABNORMAL LOW (ref 0.76–1.27)
GFR calc Af Amer: 125 mL/min/{1.73_m2} (ref >59)
GFR calc non Af Amer: 108 mL/min/{1.73_m2} (ref >59)
Globulin, Total: 2.5 g/dL (ref 1.5–4.5)
Glucose: 108 mg/dL — ABNORMAL HIGH (ref 65–99)
Potassium: 4.6 mmol/L (ref 3.5–5.2)
Sodium: 137 mmol/L (ref 134–144)
Total Protein: 7 g/dL (ref 6.0–8.5)

## 2020-05-18 MED ORDER — ATORVASTATIN CALCIUM 40 MG PO TABS: 40.0000 mg | ORAL_TABLET | Freq: Every day | ORAL | 3 refills | Status: DC

## 2020-05-18 MED ORDER — EZETIMIBE 10 MG PO TABS: 10.0000 mg | ORAL_TABLET | Freq: Every day | ORAL | 3 refills | Status: DC

## 2020-05-18 MED ORDER — TELMISARTAN-HCTZ 40-12.5 MG PO TABS: ORAL_TABLET | ORAL | 3 refills | Status: DC

## 2020-05-18 NOTE — Patient Instructions (Signed)

## 2020-05-21 ENCOUNTER — Telehealth: Payer: Self-pay

## 2020-05-21 NOTE — Telephone Encounter (Signed)
Spoke with patient regarding results and recommendation.  Patient verbalizes understanding and is agreeable to plan of care. Advised patient to call back with any issues or concerns.  

## 2020-05-21 NOTE — Telephone Encounter (Signed)
-----   Message from Baldo Daub, MD sent at 05/20/2020 10:42 AM EST ----- Much improved  Lipids at target  No changes

## 2020-09-21 DIAGNOSIS — F419 Anxiety disorder, unspecified: Secondary | ICD-10-CM | POA: Insufficient documentation

## 2020-09-21 DIAGNOSIS — I10 Essential (primary) hypertension: Secondary | ICD-10-CM | POA: Insufficient documentation

## 2020-09-21 DIAGNOSIS — E785 Hyperlipidemia, unspecified: Secondary | ICD-10-CM | POA: Insufficient documentation

## 2020-09-23 NOTE — Progress Notes (Unsigned)
Cardiology Office Note:    Date:  09/24/2020   ID:  Benjamin Goo Shane Sr., DOB 08-11-1970, MRN [id number]  PCP:  Dema Severin, NP  Cardiologist:  Norman Herrlich, MD    Referring MD: [facility]    ASSESSMENT:    1. Hypertension, essential   2. Mild CAD   3. Hyperlipidemia, unspecified hyperlipidemia type    PLAN:    In order of problems listed above:  1. Well-controlled continue current treatment 2. Stable asymptomatic restart a statin check lipid profile in 1 month 3. See above   Next appointment: 6 months   Medication Adjustments/Labs and Tests Ordered: Current medicines are reviewed at length with the patient today.  Concerns regarding medicines are outlined above.  Orders Placed This Encounter  Procedures  . Lipid panel  . Comprehensive metabolic panel  . EKG 12-Lead   No orders of the defined types were placed in this encounter.   No chief complaint on file.   History of Present Illness:    Benjamin Oki Delpino Sr. is a 50 y.o. male with a hx of  mild CAD right bundle branch block hypertension statin intolerance and hyperlipidemia  last seen 05/18/2020.  CCTA performed 10/27/2019 showed a calcium score relatively low at 5 67th percentile for age and sex normal aorta and very mild less than 25% plaque in the left main LAD and right coronary artery. Compliance with diet, lifestyle and medications: Yes it but has been out of Zetia and atorvastatin 2 weeks  He arrived today hoping to have labs done he has been off of his lipid-lowering therapy for 2 weeks hope to get off medications I negotiated with stop Zetia continue statin recheck his lipids in a month disease changes lifestyle. He has had no cardiovascular symptoms of chest pain edema shortness of breath palpitation or syncope. Past Medical History:  Diagnosis Date  . Acute coronary syndrome (HCC) 11/09/2019  . Anxiety   . Chest pain 11/09/2019  . GERD (gastroesophageal reflux disease)   .  Hyperlipidemia   . Hypertension   . Hypertension, essential 11/09/2019  . Tobacco abuse 11/09/2019    Past Surgical History:  Procedure Laterality Date  . KNEE ARTHROSCOPY Right     Current Medications: Current Meds  Medication Sig  . atorvastatin (LIPITOR) 40 MG tablet Take 1 tablet (40 mg total) by mouth daily.  . Cyanocobalamin (B-12) 1000 MCG CAPS Take 1,000 mcg by mouth daily.  . meloxicam (MOBIC) 15 MG tablet Take 15 mg by mouth daily.  . montelukast (SINGULAIR) 10 MG tablet Take 10 mg by mouth daily.  . pantoprazole (PROTONIX) 40 MG tablet Take 40 mg by mouth daily.  . tadalafil (CIALIS) 5 MG tablet Take 1 tablet (5 mg total) by mouth daily as needed for erectile dysfunction.  Marland Kitchen telmisartan-hydrochlorothiazide (MICARDIS HCT) 40-12.5 MG tablet TAKE ONE (1) TABLET BY MOUTH ONCE DAILY  . [DISCONTINUED] ezetimibe (ZETIA) 10 MG tablet Take 1 tablet (10 mg total) by mouth daily.     Allergies:   Patient has no known allergies.   Social History   Socioeconomic History  . Marital status: Married    Spouse name: Not on file  . Number of children: Not on file  . Years of education: Not on file  . Highest education level: Not on file  Occupational History  . Not on file  Tobacco Use  . Smoking status: Current Every Day Smoker  . Smokeless tobacco: Never Used  Substance and Sexual Activity  .  Alcohol use: Not on file  . Drug use: Not on file  . Sexual activity: Not on file  Other Topics Concern  . Not on file  Social History Narrative  . Not on file   Social Determinants of Health   Financial Resource Strain: Not on file  Food Insecurity: Not on file  Transportation Needs: Not on file  Physical Activity: Not on file  Stress: Not on file  Social Connections: Not on file     Family History: The patient's family history includes CAD in his father; Hypertension in his father and mother. ROS:   Please see the history of present illness.    All other systems reviewed  and are negative.  EKGs/Labs/Other Studies Reviewed:    The following studies were reviewed today:  EKG:  EKG ordered today and personally reviewed.  The ekg ordered today demonstrates sinus rhythm remains normal  Recent Labs: 05/18/2020: ALT 25; BUN 16; Creatinine, Ser 0.75; Potassium 4.6; Sodium 137  Recent Lipid Panel    Component Value Date/Time   CHOL 154 05/18/2020 0959   TRIG 141 05/18/2020 0959   HDL 38 (L) 05/18/2020 0959   CHOLHDL 4.1 05/18/2020 0959   LDLCALC 91 05/18/2020 0959    Physical Exam:    VS:  BP 134/80   Pulse 67   Ht 5\' 11"  (1.803 m)   Wt 232 lb 3.2 oz (105.3 kg)   SpO2 97%   BMI 32.39 kg/m     Wt Readings from Last 3 Encounters:  09/24/20 232 lb 3.2 oz (105.3 kg)  05/18/20 238 lb 6.4 oz (108.1 kg)  11/10/19 233 lb 3.2 oz (105.8 kg)     GEN:  Well nourished, well developed in no acute distress HEENT: Normal NECK: No JVD; No carotid bruits LYMPHATICS: No lymphadenopathy CARDIAC: RRR, no murmurs, rubs, gallops RESPIRATORY:  Clear to auscultation without rales, wheezing or rhonchi  ABDOMEN: Soft, non-tender, non-distended MUSCULOSKELETAL:  No edema; No deformity  SKIN: Warm and dry NEUROLOGIC:  Alert and oriented x 3 PSYCHIATRIC:  Normal affect    Signed, 01/10/20, MD  09/24/2020 11:52 AM    [facility]

## 2020-09-24 ENCOUNTER — Encounter: Payer: Self-pay | Admitting: Cardiology

## 2020-09-24 ENCOUNTER — Other Ambulatory Visit: Payer: Self-pay

## 2020-09-24 ENCOUNTER — Ambulatory Visit (INDEPENDENT_AMBULATORY_CARE_PROVIDER_SITE_OTHER): Payer: Self-pay | Admitting: Cardiology

## 2020-09-24 VITALS — BP 134/80 | HR 67 | Ht 71.0 in | Wt 232.2 lb

## 2020-09-24 DIAGNOSIS — I2510 Atherosclerotic heart disease of native coronary artery without angina pectoris: Secondary | ICD-10-CM

## 2020-09-24 DIAGNOSIS — E785 Hyperlipidemia, unspecified: Secondary | ICD-10-CM

## 2020-09-24 DIAGNOSIS — I10 Essential (primary) hypertension: Secondary | ICD-10-CM

## 2020-09-24 NOTE — Patient Instructions (Signed)
Medication Instructions:  Your physician has recommended you make the following change in your medication:  STOP: Zetia *If you need a refill on your cardiac medications before your next appointment, please call your pharmacy*   Lab Work: Your physician recommends that you return for lab work in: TODAY CMP, Lipids If you have labs (blood work) drawn today and your tests are completely normal, you will receive your results only by: Marland Kitchen MyChart Message (if you have MyChart) OR . A paper copy in the mail If you have any lab test that is abnormal or we need to change your treatment, we will call you to review the results.   Testing/Procedures: None   Follow-Up: At [facility], you and your health needs are our priority.  As part of our continuing mission to provide you with exceptional heart care, we have created designated Provider Care Teams.  These Care Teams include your primary Cardiologist (physician) and Advanced Practice Providers (APPs -  Physician Assistants and Nurse Practitioners) who all work together to provide you with the care you need, when you need it.  We recommend signing up for the patient portal called "MyChart".  Sign up information is provided on this After Visit Summary.  MyChart is used to connect with patients for Virtual Visits (Telemedicine).  Patients are able to view lab/test results, encounter notes, upcoming appointments, etc.  Non-urgent messages can be sent to your provider as well.   To learn more about what you can do with MyChart, go to [email or web address].    Your next appointment:   6 month(s)  The format for your next appointment:   In Person  Provider:   Norman Herrlich, MD   Other Instructions

## 2020-09-25 ENCOUNTER — Telehealth: Payer: Self-pay

## 2020-09-25 DIAGNOSIS — E785 Hyperlipidemia, unspecified: Secondary | ICD-10-CM

## 2020-09-25 LAB — LIPID PANEL
Chol/HDL Ratio: 5.6 ratio — ABNORMAL HIGH (ref 0.0–5.0)
Cholesterol, Total: 200 mg/dL — ABNORMAL HIGH (ref 100–199)
HDL: 36 mg/dL — ABNORMAL LOW (ref >39)
LDL Chol Calc (NIH): 127 mg/dL — ABNORMAL HIGH (ref 0–99)
Triglycerides: 205 mg/dL — ABNORMAL HIGH (ref 0–149)
VLDL Cholesterol Cal: 37 mg/dL (ref 5–40)

## 2020-09-25 LAB — COMPREHENSIVE METABOLIC PANEL
ALT: 29 IU/L (ref 0–44)
AST: 23 IU/L (ref 0–40)
Albumin/Globulin Ratio: 1.8 (ref 1.2–2.2)
Albumin: 4.5 g/dL (ref 4.0–5.0)
Alkaline Phosphatase: 111 IU/L (ref 44–121)
BUN/Creatinine Ratio: 16 (ref 9–20)
BUN: 13 mg/dL (ref 6–24)
Bilirubin Total: 0.4 mg/dL (ref 0.0–1.2)
CO2: 22 mmol/L (ref 20–29)
Calcium: 9.5 mg/dL (ref 8.7–10.2)
Chloride: 103 mmol/L (ref 96–106)
Creatinine, Ser: 0.8 mg/dL (ref 0.76–1.27)
Globulin, Total: 2.5 g/dL (ref 1.5–4.5)
Glucose: 103 mg/dL — ABNORMAL HIGH (ref 65–99)
Potassium: 4.2 mmol/L (ref 3.5–5.2)
Sodium: 140 mmol/L (ref 134–144)
Total Protein: 7 g/dL (ref 6.0–8.5)
eGFR: 108 mL/min/{1.73_m2} (ref >59)

## 2020-09-25 NOTE — Telephone Encounter (Signed)
-----   Message from Baldo Daub, MD sent at 09/25/2020  7:56 AM EDT ----- Cholesterol moderately elevated, let us wait about 4 weeks and recheck now that he is back on a statin.

## 2020-09-25 NOTE — Telephone Encounter (Signed)
Spoke with patient regarding results and recommendation.  Patient verbalizes understanding and is agreeable to plan of care. Advised patient to call back with any issues or concerns.  

## 2020-10-05 ENCOUNTER — Telehealth: Payer: Self-pay | Admitting: Cardiology

## 2020-10-05 NOTE — Telephone Encounter (Signed)
It would be unusual for a statin related problem was localized like this to 1 area.  I do not see why he cannot stop his statin for a few weeks if unimproved though I did look in a different direction and see his family doctor's this may be more of a problem in the neck and shoulder.

## 2020-10-05 NOTE — Telephone Encounter (Signed)
  Pt c/o medication issue:  1. Name of Medication:  atorvastatin (LIPITOR) 40 MG tablet  2. How are you currently taking this medication (dosage and times per day)?  As directed  3. Are you having a reaction (difficulty breathing--STAT)?  Pain in neck, left shoulder and arm  4. What is your medication issue? Patient states he started back on the statin medication and he is having pain on the left side of neck, shoulder and arm. He would like to discuss.

## 2020-10-05 NOTE — Telephone Encounter (Signed)
Spoke to the patient just now and let him know Dr. Munley's recommendations. He verbalizes understanding and thanks me for the call back.  

## 2021-04-02 NOTE — Progress Notes (Signed)
Cardiology Office Note:    Date:  04/03/2021   ID:  Benjamin Goo Wageman Sr., DOB March 27, 1971, MRN [id number]  PCP:  Dema Severin, NP  Cardiologist:  Norman Herrlich, MD    Referring MD: Dema Severin, NP    ASSESSMENT:    1. Mild CAD   2. Hypertension, essential   3. Hyperlipidemia, unspecified hyperlipidemia type    PLAN:    In order of problems listed above:  Stable mild CAD having no angina restart aspirin 81 mg daily continue atorvastatin and Zetia check lipid profile CMP BP is at target continue his ARB thiazide diuretic and check labs including renal function potassium lipid profile and liver function.   Next appointment: 1 year   Medication Adjustments/Labs and Tests Ordered: Current medicines are reviewed at length with the patient today.  Concerns regarding medicines are outlined above.  No orders of the defined types were placed in this encounter.  No orders of the defined types were placed in this encounter.   Chief Complaint  Patient presents with   Follow-up   Coronary Artery Disease    History of Present Illness:    Benjamin Magan Merlo Sr. is a 50 y.o. male with a hx of  mild CAD right bundle branch block hypertension statin intolerance and hyperlipidemia  last seen 09/24/2020.  CCTA performed 10/27/2019 showed a calcium score relatively low at 5 67th percentile for age and sex normal aorta and very mild less than 25% plaque in the left main LAD and right coronary artery.  Compliance with diet, lifestyle and medications: Yes  He was having clear musculoskeletal positional localized left shoulder pain when he is in the emergency room has not recurred.  He has had it intermittently in the past. He is having no angina shortness of breath palpitation or syncope. He has not taking aspirin lowers cardiac CTA report with him and stressed the importance of medical therapy of aspirin and effective lipid-lowering.  He was seen in [facility] 03/26/2021 with  complaints of chest pain.  He was afebrile and heart rate 66 bpm blood pressure 133/80 oxygen saturation 97%.  Troponin was undetectable proBNP level very low at 21 CBC and BMP were both normal.  His chest pain was felt to be noncardiac in etiology he was discharged from the emergency room.  Chest x-ray showed no acute findings.  EKG personally reviewed by me showed sinus rhythm incomplete right bundle branch block otherwise normal.  He had a lipid profile 01/03/2021 total cholesterol 113 LDL 68 triglycerides 101 HDL 36 Past Medical History:  Diagnosis Date   Acute coronary syndrome (HCC) 11/09/2019   Anxiety    Chest pain 11/09/2019   GERD (gastroesophageal reflux disease)    Hyperlipidemia    Hypertension    Hypertension, essential 11/09/2019   Tobacco abuse 11/09/2019    Past Surgical History:  Procedure Laterality Date   KNEE ARTHROSCOPY Right     Current Medications: Current Meds  Medication Sig   atorvastatin (LIPITOR) 40 MG tablet Take 1 tablet (40 mg total) by mouth daily.   Cyanocobalamin (B-12) 1000 MCG CAPS Take 1,000 mcg by mouth daily.   ezetimibe (ZETIA) 10 MG tablet Take 10 mg by mouth daily.   montelukast (SINGULAIR) 10 MG tablet Take 10 mg by mouth daily.   pantoprazole (PROTONIX) 40 MG tablet Take 40 mg by mouth daily.   telmisartan-hydrochlorothiazide (MICARDIS HCT) 40-12.5 MG tablet TAKE ONE (1) TABLET BY MOUTH ONCE DAILY  Allergies:   Patient has no known allergies.   Social History   Socioeconomic History   Marital status: Married    Spouse name: Not on file   Number of children: Not on file   Years of education: Not on file   Highest education level: Not on file  Occupational History   Not on file  Tobacco Use   Smoking status: Every Day   Smokeless tobacco: Never  Substance and Sexual Activity   Alcohol use: Not on file   Drug use: Not on file   Sexual activity: Not on file  Other Topics Concern   Not on file  Social History Narrative   Not on  file   Social Determinants of Health   Financial Resource Strain: Not on file  Food Insecurity: Not on file  Transportation Needs: Not on file  Physical Activity: Not on file  Stress: Not on file  Social Connections: Not on file     Family History: The patient's family history includes CAD in his father; Hypertension in his father and mother. ROS:   Please see the history of present illness.    All other systems reviewed and are negative.  EKGs/Labs/Other Studies Reviewed:    The following studies were reviewed today:    Recent Labs: 09/24/2020: ALT 29; BUN 13; Creatinine, Ser 0.80; Potassium 4.2; Sodium 140  Recent Lipid Panel    Component Value Date/Time   CHOL 200 (H) 09/24/2020 1157   TRIG 205 (H) 09/24/2020 1157   HDL 36 (L) 09/24/2020 1157   CHOLHDL 5.6 (H) 09/24/2020 1157   LDLCALC 127 (H) 09/24/2020 1157    Physical Exam:    VS:  BP 120/74 (BP Location: Right Arm, Patient Position: Sitting)   Pulse 68   Ht 5\' 10"  (1.778 m)   Wt 231 lb (104.8 kg)   SpO2 96%   BMI 33.15 kg/m     Wt Readings from Last 3 Encounters:  04/03/21 231 lb (104.8 kg)  09/24/20 232 lb 3.2 oz (105.3 kg)  05/18/20 238 lb 6.4 oz (108.1 kg)     GEN:  Well nourished, well developed in no acute distress HEENT: Normal NECK: No JVD; No carotid bruits LYMPHATICS: No lymphadenopathy CARDIAC: RRR, no murmurs, rubs, gallops RESPIRATORY:  Clear to auscultation without rales, wheezing or rhonchi  ABDOMEN: Soft, non-tender, non-distended MUSCULOSKELETAL:  No edema; No deformity  SKIN: Warm and dry NEUROLOGIC:  Alert and oriented x 3 PSYCHIATRIC:  Normal affect    Signed, 13/12/21, MD  04/03/2021 9:41 AM    [facility]

## 2021-04-03 ENCOUNTER — Other Ambulatory Visit: Payer: Self-pay

## 2021-04-03 ENCOUNTER — Encounter: Payer: Self-pay | Admitting: Cardiology

## 2021-04-03 ENCOUNTER — Ambulatory Visit (INDEPENDENT_AMBULATORY_CARE_PROVIDER_SITE_OTHER): Payer: Self-pay | Admitting: Cardiology

## 2021-04-03 VITALS — BP 120/74 | HR 68 | Ht 70.0 in | Wt 231.0 lb

## 2021-04-03 DIAGNOSIS — I2510 Atherosclerotic heart disease of native coronary artery without angina pectoris: Secondary | ICD-10-CM

## 2021-04-03 DIAGNOSIS — I10 Essential (primary) hypertension: Secondary | ICD-10-CM

## 2021-04-03 DIAGNOSIS — E785 Hyperlipidemia, unspecified: Secondary | ICD-10-CM

## 2021-04-03 MED ORDER — ASPIRIN EC 81 MG PO TBEC: 81.0000 mg | DELAYED_RELEASE_TABLET | Freq: Every day | ORAL | 3 refills | Status: AC

## 2021-04-03 NOTE — Patient Instructions (Signed)
Medication Instructions:  Your physician has recommended you make the following change in your medication:   Start coated aspirin 81 mg daily.  *If you need a refill on your cardiac medications before your next appointment, please call your pharmacy*   Lab Work: Your physician recommends that you have a CMP and lipids done today.  If you have labs (blood work) drawn today and your tests are completely normal, you will receive your results only by: MyChart Message (if you have MyChart) OR A paper copy in the mail If you have any lab test that is abnormal or we need to change your treatment, we will call you to review the results.   Testing/Procedures: None ordered   Follow-Up: At [facility], you and your health needs are our priority.  As part of our continuing mission to provide you with exceptional heart care, we have created designated Provider Care Teams.  These Care Teams include your primary Cardiologist (physician) and Advanced Practice Providers (APPs -  Physician Assistants and Nurse Practitioners) who all work together to provide you with the care you need, when you need it.  We recommend signing up for the patient portal called "MyChart".  Sign up information is provided on this After Visit Summary.  MyChart is used to connect with patients for Virtual Visits (Telemedicine).  Patients are able to view lab/test results, encounter notes, upcoming appointments, etc.  Non-urgent messages can be sent to your provider as well.   To learn more about what you can do with MyChart, go to [email or web address].    Your next appointment:   12 month(s)  The format for your next appointment:   In Person  Provider:   Norman Herrlich, MD   Other Instructions NA

## 2021-04-04 LAB — COMPREHENSIVE METABOLIC PANEL
ALT: 37 IU/L (ref 0–44)
AST: 23 IU/L (ref 0–40)
Albumin/Globulin Ratio: 2.1 (ref 1.2–2.2)
Albumin: 4.5 g/dL (ref 4.0–5.0)
Alkaline Phosphatase: 108 IU/L (ref 44–121)
BUN/Creatinine Ratio: 25 — ABNORMAL HIGH (ref 9–20)
BUN: 19 mg/dL (ref 6–24)
Bilirubin Total: 0.5 mg/dL (ref 0.0–1.2)
CO2: 24 mmol/L (ref 20–29)
Calcium: 9.3 mg/dL (ref 8.7–10.2)
Chloride: 103 mmol/L (ref 96–106)
Creatinine, Ser: 0.76 mg/dL (ref 0.76–1.27)
Globulin, Total: 2.1 g/dL (ref 1.5–4.5)
Glucose: 104 mg/dL — ABNORMAL HIGH (ref 70–99)
Potassium: 4.2 mmol/L (ref 3.5–5.2)
Sodium: 141 mmol/L (ref 134–144)
Total Protein: 6.6 g/dL (ref 6.0–8.5)
eGFR: 109 mL/min/{1.73_m2} (ref >59)

## 2021-04-04 LAB — LIPID PANEL
Chol/HDL Ratio: 3.7 ratio (ref 0.0–5.0)
Cholesterol, Total: 119 mg/dL (ref 100–199)
HDL: 32 mg/dL — ABNORMAL LOW (ref >39)
LDL Chol Calc (NIH): 68 mg/dL (ref 0–99)
Triglycerides: 99 mg/dL (ref 0–149)
VLDL Cholesterol Cal: 19 mg/dL (ref 5–40)

## 2021-05-28 ENCOUNTER — Other Ambulatory Visit: Payer: Self-pay | Admitting: Cardiology

## 2021-07-25 ENCOUNTER — Other Ambulatory Visit: Payer: Self-pay

## 2021-07-25 MED ORDER — TELMISARTAN-HCTZ 40-12.5 MG PO TABS: ORAL_TABLET | ORAL | 3 refills | Status: DC

## 2021-07-25 MED ORDER — ATORVASTATIN CALCIUM 40 MG PO TABS: ORAL_TABLET | ORAL | 3 refills | Status: DC

## 2021-07-25 MED ORDER — EZETIMIBE 10 MG PO TABS: ORAL_TABLET | ORAL | 3 refills | Status: DC

## 2021-07-29 IMAGING — CT CT HEART MORP W/ CTA COR W/ SCORE W/ CA W/CM &/OR W/O CM
4 of 7 series · 8 of 20 positions shown, 9 images · IV contrast (APPLIED)
Comparison: None.

Addendum:
CLINICAL DATA: Chest pain

EXAM:
Cardiac CTA
MEDICATIONS:
Sub lingual nitro.  4mg
TECHNIQUE: The patient was scanned on a Siemens Force [REDACTED]ice scanner. Gantry
rotation speed was 250 msecs. Collimation was .6 mm. A 100 kV
prospective scan was triggered in the ascending thoracic aorta at
140 HU's Full mA was used between 35% and 75% of the R-R interval.
Average HR during the scan was 55 bpm. The 3D data set was
interpreted on a dedicated work station using MPR, MIP and VRT
modes. A total of 80 cc of contrast was used.

[Series 7: best diast 74 % · axial · 0.41mm/px · z∈[-188,-142]mm · 2 of 348 slices shown, 3 images]
[im 116/348  vessel]
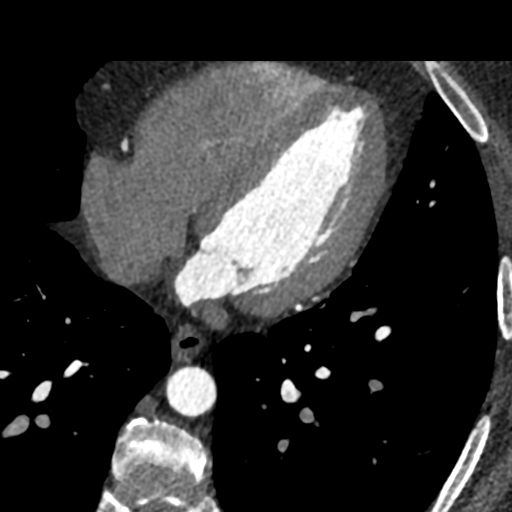
[im 116/348  lung]
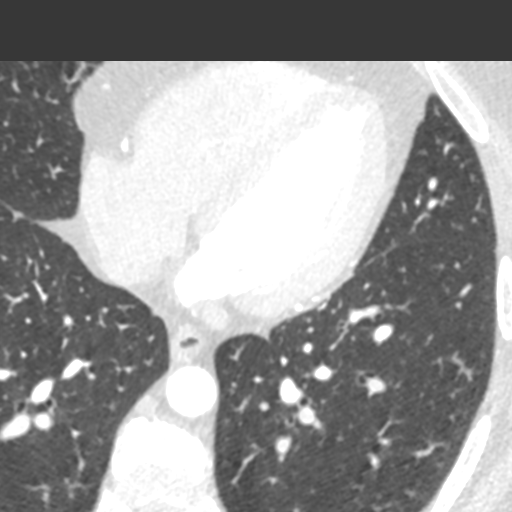
[im 232/348  vessel]
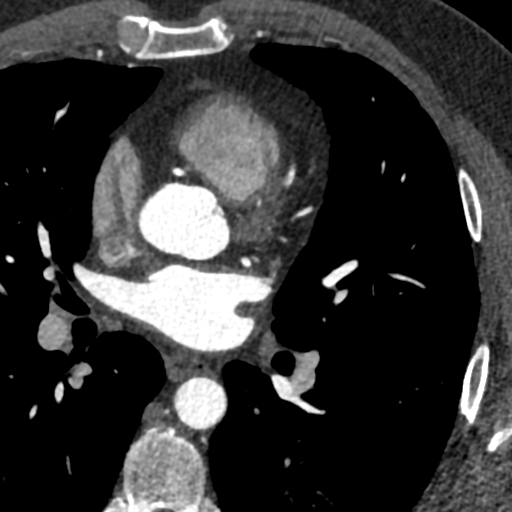

[Series 8: best syst 33 % · axial · 0.41mm/px · z∈[-188,-142]mm · 2 of 348 slices shown]
[im 116/348  vessel]
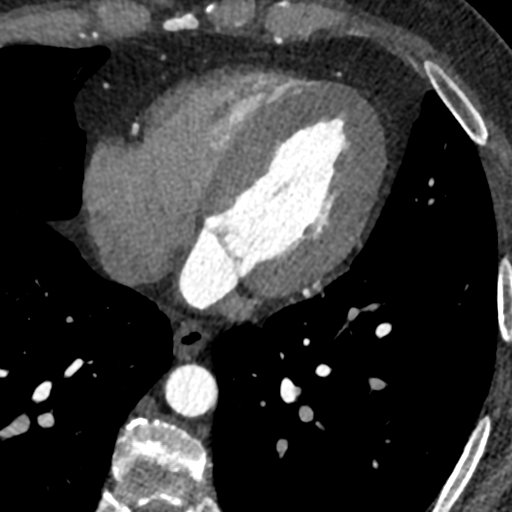
[im 232/348  vessel]
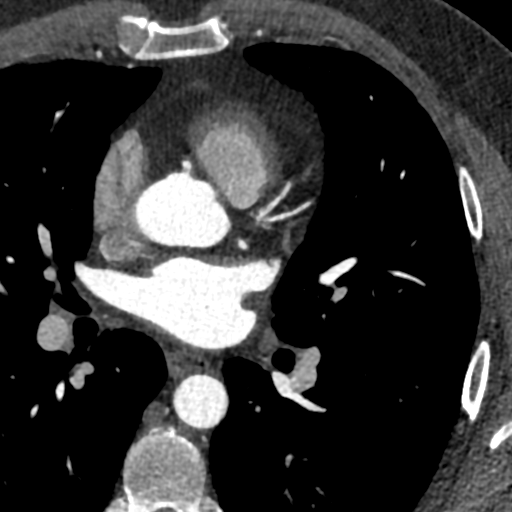

[Series 9: ts diast sharp 33 % · axial · 0.41mm/px · z∈[-188,-142]mm · 2 of 348 slices shown]
[im 116/348  lung]
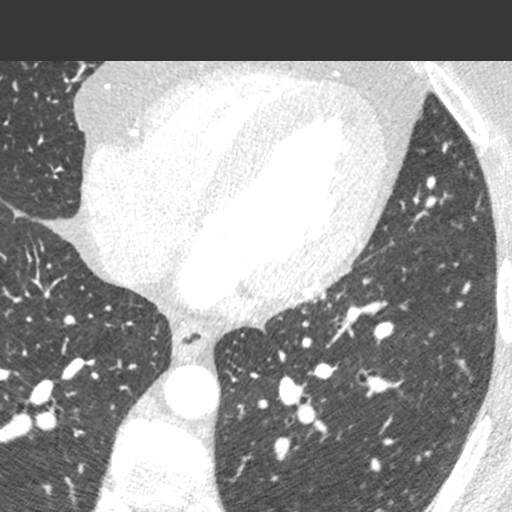
[im 232/348  lung]
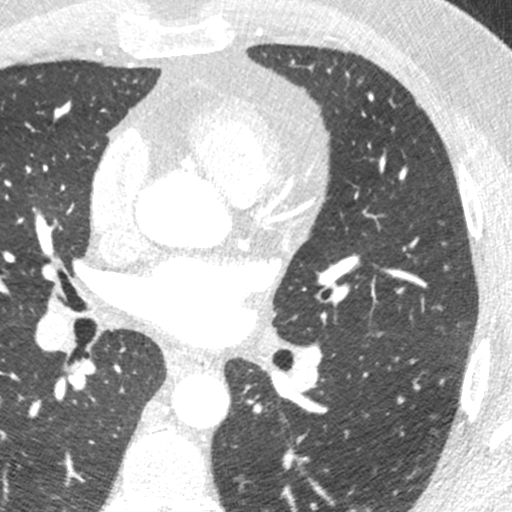

[Series 10: ts syst sharp 33 % · axial · 0.41mm/px · z∈[-188,-142]mm · 2 of 348 slices shown]
[im 116/348  lung]
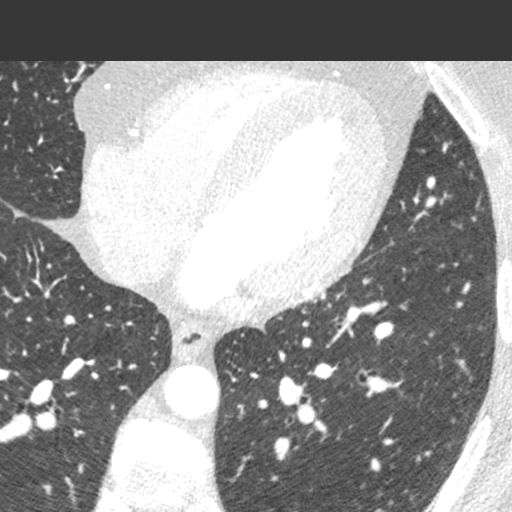
[im 232/348  lung]
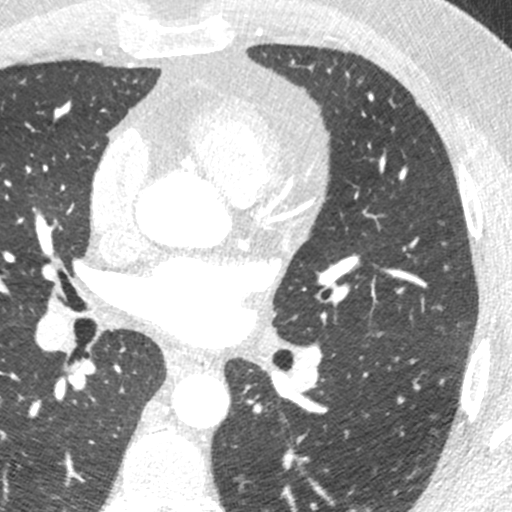

[8 of 20 positions shown; findings below may reference images not displayed]

FINDINGS: Non-cardiac: See separate report from [REDACTED]. No
significant findings on limited lung and soft tissue windows.

Calcium Score: Mild punctate calcium noted in proximal  LM/RCA/LAD

Coronary Arteries: Right dominant with no anomalies

LM: 1-24% calcific plaque at ostium

LAD: 1-24% mixed plaque in the mid LAD at septal take off

D1: Normal

D2: Normal

Circumflex: Normal

OM1: Normal

OM2: Normal

OM3: Normal

RCA: 1-24% calcified plaque in mid RCA

PDA: Normal

PLA: Aujla
IMPRESSION: 1. Calcium score 5 which is 67 th percentile for age and sex

2.  Normal aortic root diameter 3.1 cm

3.  CAD RADS 1 non obstructive CAD see description above

Gliserio Mego

EXAM:
OVER-READ INTERPRETATION  CT CHEST

The following report is an over-read performed by radiologist Dr.
over-read does not include interpretation of cardiac or coronary
anatomy or pathology. The coronary calcium scoring cardiac CT exam
interpretation by the cardiologist is attached.
FINDINGS: Within the visualized portions of the thorax there are no suspicious
appearing pulmonary nodules or masses, there is no acute
consolidative airspace disease, no pleural effusions, no
pneumothorax and no lymphadenopathy. Visualized portions of the
upper abdomen are unremarkable. There are no aggressive appearing
lytic or blastic lesions noted in the visualized portions of the
skeleton.
IMPRESSION: No significant incidental noncardiac findings are noted.

*** End of Addendum ***
FINDINGS: Non-cardiac: See separate report from [REDACTED]. No
significant findings on limited lung and soft tissue windows.

Calcium Score: Mild punctate calcium noted in proximal  LM/RCA/LAD

Coronary Arteries: Right dominant with no anomalies

LM: 1-24% calcific plaque at ostium

LAD: 1-24% mixed plaque in the mid LAD at septal take off

D1: Normal

D2: Normal

Circumflex: Normal

OM1: Normal

OM2: Normal

OM3: Normal

RCA: 1-24% calcified plaque in mid RCA

PDA: Normal

PLA: Aujla
IMPRESSION: 1. Calcium score 5 which is 67 th percentile for age and sex

2.  Normal aortic root diameter 3.1 cm

3.  CAD RADS 1 non obstructive CAD see description above

Gliserio Mego

## 2021-09-09 ENCOUNTER — Telehealth: Payer: Self-pay | Admitting: Cardiology

## 2021-09-09 DIAGNOSIS — E785 Hyperlipidemia, unspecified: Secondary | ICD-10-CM

## 2021-09-09 DIAGNOSIS — I10 Essential (primary) hypertension: Secondary | ICD-10-CM

## 2021-09-09 NOTE — Telephone Encounter (Signed)
Patient called in requesting an appt with Dr. Dulce Sellar for a 6 month f/u. Advised pt per Dr. Dulce Sellar his next f/u is not due until September due to him requesting it be 1 year. Patient requested a 6 month f/u anyway's due to him f/u every 6 months in the past. He is also requesting to have labs performed on the morning of his appt. States he is not having any symptoms causing him to request an appt. Please advise.  ?

## 2021-09-13 NOTE — Telephone Encounter (Signed)
Patient notified, reschedule next week and will stop by on Monday for blood work  ?

## 2021-09-16 ENCOUNTER — Other Ambulatory Visit: Payer: Self-pay

## 2021-09-16 DIAGNOSIS — I10 Essential (primary) hypertension: Secondary | ICD-10-CM

## 2021-09-16 DIAGNOSIS — E785 Hyperlipidemia, unspecified: Secondary | ICD-10-CM

## 2021-09-17 LAB — BASIC METABOLIC PANEL
BUN/Creatinine Ratio: 20 (ref 9–20)
BUN: 16 mg/dL (ref 6–24)
CO2: 25 mmol/L (ref 20–29)
Calcium: 9.2 mg/dL (ref 8.7–10.2)
Chloride: 105 mmol/L (ref 96–106)
Creatinine, Ser: 0.8 mg/dL (ref 0.76–1.27)
Glucose: 115 mg/dL — ABNORMAL HIGH (ref 70–99)
Potassium: 3.9 mmol/L (ref 3.5–5.2)
Sodium: 142 mmol/L (ref 134–144)
eGFR: 107 mL/min/{1.73_m2} (ref >59)

## 2021-09-17 LAB — LIPID PANEL
Chol/HDL Ratio: 3.9 ratio (ref 0.0–5.0)
Cholesterol, Total: 121 mg/dL (ref 100–199)
HDL: 31 mg/dL — ABNORMAL LOW (ref >39)
LDL Chol Calc (NIH): 66 mg/dL (ref 0–99)
Triglycerides: 133 mg/dL (ref 0–149)
VLDL Cholesterol Cal: 24 mg/dL (ref 5–40)

## 2021-09-18 ENCOUNTER — Ambulatory Visit (INDEPENDENT_AMBULATORY_CARE_PROVIDER_SITE_OTHER): Payer: Self-pay | Admitting: Cardiology

## 2021-09-18 ENCOUNTER — Other Ambulatory Visit: Payer: Self-pay

## 2021-09-18 ENCOUNTER — Encounter: Payer: Self-pay | Admitting: Cardiology

## 2021-09-18 VITALS — BP 110/70 | HR 76 | Ht 70.0 in | Wt 232.0 lb

## 2021-09-18 DIAGNOSIS — R931 Abnormal findings on diagnostic imaging of heart and coronary circulation: Secondary | ICD-10-CM

## 2021-09-18 DIAGNOSIS — E782 Mixed hyperlipidemia: Secondary | ICD-10-CM

## 2021-09-18 DIAGNOSIS — N529 Male erectile dysfunction, unspecified: Secondary | ICD-10-CM

## 2021-09-18 DIAGNOSIS — I10 Essential (primary) hypertension: Secondary | ICD-10-CM

## 2021-09-18 DIAGNOSIS — I2510 Atherosclerotic heart disease of native coronary artery without angina pectoris: Secondary | ICD-10-CM

## 2021-09-18 DIAGNOSIS — I4510 Unspecified right bundle-branch block: Secondary | ICD-10-CM

## 2021-09-18 MED ORDER — TADALAFIL 5 MG PO TABS: 5.0000 mg | ORAL_TABLET | Freq: Every day | ORAL | 3 refills | Status: DC | PRN

## 2021-09-18 NOTE — Patient Instructions (Signed)
Medication Instructions:  ?Your physician recommends that you continue on your current medications as directed. Please refer to the Current Medication list given to you today. ? ?*If you need a refill on your cardiac medications before your next appointment, please call your pharmacy* ? ? ?Lab Work: ?NONE ?If you have labs (blood work) drawn today and your tests are completely normal, you will receive your results only by: ?MyChart Message (if you have MyChart) OR ?A paper copy in the mail ?If you have any lab test that is abnormal or we need to change your treatment, we will call you to review the results. ? ? ?Testing/Procedures: ?NONE ? ? ?Follow-Up: ?At [facility], you and your health needs are our priority.  As part of our continuing mission to provide you with exceptional heart care, we have created designated Provider Care Teams.  These Care Teams include your primary Cardiologist (physician) and Advanced Practice Providers (APPs -  Physician Assistants and Nurse Practitioners) who all work together to provide you with the care you need, when you need it. ? ?We recommend signing up for the patient portal called "MyChart".  Sign up information is provided on this After Visit Summary.  MyChart is used to connect with patients for Virtual Visits (Telemedicine).  Patients are able to view lab/test results, encounter notes, upcoming appointments, etc.  Non-urgent messages can be sent to your provider as well.   ?To learn more about what you can do with MyChart, go to [email or web address].   ? ?Your next appointment:   ?6 month(s) ? ?The format for your next appointment:   ?In Person ? ?Provider:   ?Brian Munley, MD  ? ? ?Other Instructions ?  ?

## 2021-09-18 NOTE — Progress Notes (Signed)
?Cardiology Office Note:   ? ?Date:  09/18/2021  ? ?ID:  Benjamin Goo Daywalt Sr., DOB 01-05-1971, MRN [id number] ? ?PCP:  Dema Severin, NP  ?Cardiologist:  Norman Herrlich, MD   ? ?Referring MD: Dema Severin, NP  ? ? ?ASSESSMENT:   ? ?1. Mild CAD   ?2. Agatston coronary artery calcium score less than 100   ?3. Right bundle branch block   ?4. Essential hypertension   ?5. Mixed hyperlipidemia   ?6. Erectile dysfunction, unspecified erectile dysfunction type   ? ?PLAN:   ? ?In order of problems listed above: ? ?Stable doing well on appropriate medical therapy including aspirin high intensity statin and Zetia and BP medication.  New York Heart Association class. ?Blood pressure target continue treatment ARB thiazide ?Continue combined lipid-lowering ?Trial Cialis ? ? ? ?Next appointment: 6 months ? ? ?Medication Adjustments/Labs and Tests Ordered: ?Current medicines are reviewed at length with the patient today.  Concerns regarding medicines are outlined above.  ?No orders of the defined types were placed in this encounter. ? ?Meds ordered this encounter  ?Medications  ? tadalafil (CIALIS) 5 MG tablet  ?  Sig: Take 1 tablet (5 mg total) by mouth daily as needed for erectile dysfunction.  ?  Dispense:  10 tablet  ?  Refill:  3  ? ? ?Chief Complaint  ?Patient presents with  ? Follow-up  ? Coronary Artery Disease  ? Hyperlipidemia  ? ? ?History of Present Illness:   ? ?Benjamin Rafferty Bowerman Sr. is a 51 y.o. male with a hx of  mild CAD right bundle branch block hypertension statin intolerance and hyperlipidemia  last seen 04/03/2021.  CCTA performed 10/27/2019 showed a calcium score relatively low at 5 67th percentile for age and sex normal aorta and very mild less than 25% plaque in the left main LAD and right coronary artery. ? ?He was seen in [facility] ED 03/26/2021 with complaints of chest pain.  He was afebrile and heart rate 66 bpm blood pressure 133/80 oxygen saturation 97%.  Troponin was undetectable proBNP level  very low at 21 CBC and BMP were both normal.  His chest pain was felt to be noncardiac in etiology he was discharged from the emergency room.  Chest x-ray showed no acute findings.  EKG personally reviewed by me showed sinus rhythm incomplete right bundle branch block otherwise normal  ? ?Compliance with diet, lifestyle and medications: Yes ? ?Sadly his father died a year for the past year leukemia. ?He has no angina edema shortness of breath palpitation or syncope ?He has had some erectile dysfunction he had tried Viagra in the past it worked quite well and ongoing getting head and given a prescription for Cialis. ?Past Medical History:  ?Diagnosis Date  ? Acute coronary syndrome (HCC) 11/09/2019  ? Anxiety   ? Chest pain 11/09/2019  ? GERD (gastroesophageal reflux disease)   ? Hyperlipidemia   ? Hypertension   ? Hypertension, essential 11/09/2019  ? Tobacco abuse 11/09/2019  ? ? ?Past Surgical History:  ?Procedure Laterality Date  ? KNEE ARTHROSCOPY Right   ? ? ?Current Medications: ?Current Meds  ?Medication Sig  ? aspirin EC 81 MG tablet Take 1 tablet (81 mg total) by mouth daily. Swallow whole.  ? atorvastatin (LIPITOR) 40 MG tablet TAKE ONE (1) TABLET ONCE DAILY  ? Cyanocobalamin (B-12) 1000 MCG CAPS Take 1,000 mcg by mouth daily.  ? ezetimibe (ZETIA) 10 MG tablet TAKE ONE (1) TABLET ONCE DAILY  ?  montelukast (SINGULAIR) 10 MG tablet Take 10 mg by mouth daily.  ? pantoprazole (PROTONIX) 40 MG tablet Take 40 mg by mouth daily.  ? tadalafil (CIALIS) 5 MG tablet Take 1 tablet (5 mg total) by mouth daily as needed for erectile dysfunction.  ? telmisartan-hydrochlorothiazide (MICARDIS HCT) 40-12.5 MG tablet TAKE ONE (1) TABLET ONCE DAILY  ?  ? ?Allergies:   Patient has no known allergies.  ? ?Social History  ? ?Socioeconomic History  ? Marital status: Married  ?  Spouse name: Not on file  ? Number of children: Not on file  ? Years of education: Not on file  ? Highest education level: Not on file  ?Occupational History  ?  Not on file  ?Tobacco Use  ? Smoking status: Every Day  ?  Passive exposure: Current  ? Smokeless tobacco: Never  ?Vaping Use  ? Vaping Use: Never used  ?Substance and Sexual Activity  ? Alcohol use: Not on file  ? Drug use: Never  ? Sexual activity: Not on file  ?Other Topics Concern  ? Not on file  ?Social History Narrative  ? Not on file  ? ?Social Determinants of Health  ? ?Financial Resource Strain: Not on file  ?Food Insecurity: Not on file  ?Transportation Needs: Not on file  ?Physical Activity: Not on file  ?Stress: Not on file  ?Social Connections: Not on file  ?  ? ?Family History: ?The patient's family history includes CAD in his father; Hypertension in his father and mother. ?ROS:   ?Please see the history of present illness.    ?All other systems reviewed and are negative. ? ?EKGs/Labs/Other Studies Reviewed:   ? ?The following studies were reviewed today: ? ?His recent lipids were ideal ?Elevated glucose he is going low prediabetes ? ?Recent Labs: ?04/03/2021: ALT 37 ?09/16/2021: BUN 16; Creatinine, Ser 0.80; Potassium 3.9; Sodium 142  ?Recent Lipid Panel ?   ?Component Value Date/Time  ? CHOL 121 09/16/2021 0834  ? TRIG 133 09/16/2021 0834  ? HDL 31 (L) 09/16/2021 0834  ? CHOLHDL 3.9 09/16/2021 0834  ? LDLCALC 66 09/16/2021 0834  ? ? ?Physical Exam:   ? ?VS:  BP 110/70 (BP Location: Left Arm)   Pulse 76   Ht 5\' 10"  (1.778 m)   Wt 232 lb (105.2 kg)   SpO2 96%   BMI 33.29 kg/m?    ? ?Wt Readings from Last 3 Encounters:  ?09/18/21 232 lb (105.2 kg)  ?04/03/21 231 lb (104.8 kg)  ?09/24/20 232 lb 3.2 oz (105.3 kg)  ?  ? ?GEN:  Well nourished, well developed in no acute distress ?HEENT: Normal ?NECK: No JVD; No carotid bruits ?LYMPHATICS: No lymphadenopathy ?CARDIAC: RRR, no murmurs, rubs, gallops ?RESPIRATORY:  Clear to auscultation without rales, wheezing or rhonchi  ?ABDOMEN: Soft, non-tender, non-distended ?MUSCULOSKELETAL:  No edema; No deformity  ?SKIN: Warm and dry ?NEUROLOGIC:  Alert and oriented  x 3 ?PSYCHIATRIC:  Normal affect  ? ? ?Signed, ?09/26/20, MD  ?09/18/2021 3:50 PM    ?[facility]  ?

## 2021-10-31 ENCOUNTER — Ambulatory Visit: Payer: Self-pay | Admitting: Cardiology

## 2022-03-31 ENCOUNTER — Ambulatory Visit: Payer: Self-pay | Attending: Cardiology | Admitting: Cardiology

## 2022-03-31 NOTE — Progress Notes (Deleted)
Cardiology Office Note:    Date:  03/31/2022   ID:  Benjamin Ion Robards Sr., DOB [birth year], MRN [id number]  PCP:  Rhea Bleacher, NP  Cardiologist:  Shirlee More, MD    Referring MD: Rhea Bleacher, NP    ASSESSMENT:    No diagnosis found. PLAN:    In order of problems listed above:  ***   Next appointment: ***   Medication Adjustments/Labs and Tests Ordered: Current medicines are reviewed at length with the patient today.  Concerns regarding medicines are outlined above.  No orders of the defined types were placed in this encounter.  No orders of the defined types were placed in this encounter.   No chief complaint on file.   History of Present Illness:    Benjamin Polak Matters Sr. is a 51 y.o. male with a hx of  mild CAD right bundle branch block hypertension statin intolerance and hyperlipidemia  last seen 09/18/2021.CCTA performed 10/27/2019 showed a calcium score relatively low at 5 67th percentile for age and sex normal aorta and very mild less than 25% plaque in the left main LAD and right coronary artery.  He was seen in [place] ED 03/26/2021 with complaints of chest pain.  He was afebrile and heart rate 66 bpm blood pressure 133/80 oxygen saturation 97%.  Troponin was undetectable proBNP level very low at 21 CBC and BMP were both normal.  His chest pain was felt to be noncardiac in etiology he was discharged from the emergency room.  Chest x-ray showed no acute findings.  EKG personally reviewed by me showed sinus rhythm incomplete right bundle branch block otherwise normal   Compliance with diet, lifestyle and medications: *** Past Medical History:  Diagnosis Date   Acute coronary syndrome ([place]) 11/09/2019   Anxiety    Chest pain 11/09/2019   GERD (gastroesophageal reflux disease)    Hyperlipidemia    Hypertension    Hypertension, essential 11/09/2019   Tobacco abuse 11/09/2019    Past Surgical History:  Procedure Laterality Date   KNEE ARTHROSCOPY Right      Current Medications: No outpatient medications have been marked as taking for the 03/31/22 encounter (Appointment) with Richardo Priest, MD.     Allergies:   Patient has no known allergies.   Social History   Socioeconomic History   Marital status: Married    Spouse name: Not on file   Number of children: Not on file   Years of education: Not on file   Highest education level: Not on file  Occupational History   Not on file  Tobacco Use   Smoking status: Every Day    Passive exposure: Current   Smokeless tobacco: Never  Vaping Use   Vaping Use: Never used  Substance and Sexual Activity   Alcohol use: Not on file   Drug use: Never   Sexual activity: Not on file  Other Topics Concern   Not on file  Social History Narrative   Not on file   Social Determinants of Health   Financial Resource Strain: Not on file  Food Insecurity: Not on file  Transportation Needs: Not on file  Physical Activity: Not on file  Stress: Not on file  Social Connections: Not on file     Family History: The patient's ***family history includes CAD in his father; Hypertension in his father and mother. ROS:   Please see the history of present illness.    All other systems reviewed and are negative.  EKGs/Labs/Other Studies Reviewed:    The following studies were reviewed today:  EKG:  EKG ordered today and personally reviewed.  The ekg ordered today demonstrates ***  Recent Labs: 04/03/2021: ALT 37 09/16/2021: BUN 16; Creatinine, Ser 0.80; Potassium 3.9; Sodium 142  Recent Lipid Panel    Component Value Date/Time   CHOL 121 09/16/2021 0834   TRIG 133 09/16/2021 0834   HDL 31 (L) 09/16/2021 0834   CHOLHDL 3.9 09/16/2021 0834   LDLCALC 66 09/16/2021 0834    Physical Exam:    VS:  There were no vitals taken for this visit.    Wt Readings from Last 3 Encounters:  09/18/21 232 lb (105.2 kg)  04/03/21 231 lb (104.8 kg)  09/24/20 232 lb 3.2 oz (105.3 kg)     GEN: *** Well  nourished, well developed in no acute distress HEENT: Normal NECK: No JVD; No carotid bruits LYMPHATICS: No lymphadenopathy CARDIAC: ***RRR, no murmurs, rubs, gallops RESPIRATORY:  Clear to auscultation without rales, wheezing or rhonchi  ABDOMEN: Soft, non-tender, non-distended MUSCULOSKELETAL:  No edema; No deformity  SKIN: Warm and dry NEUROLOGIC:  Alert and oriented x 3 PSYCHIATRIC:  Normal affect    Signed, Shirlee More, MD  03/31/2022 9:36 AM    [facility]

## 2022-04-01 ENCOUNTER — Encounter: Payer: Self-pay | Admitting: Cardiology

## 2022-08-01 ENCOUNTER — Other Ambulatory Visit: Payer: Self-pay | Admitting: Cardiology

## 2022-08-18 NOTE — Progress Notes (Unsigned)
Cardiology Office Note:    Date:  08/19/2022   ID:  Benjamin Ion Ortez Sr., DOB 1971-01-09, MRN [id number]  PCP:  Rhea Bleacher, NP  Cardiologist:  Shirlee More, MD    Referring MD: Rhea Bleacher, NP    ASSESSMENT:    1. Mild CAD   2. Right bundle branch block   3. Essential hypertension   4. Mixed hyperlipidemia    PLAN:    In order of problems listed above:  He continues to do well with his mild CAD on appropriate medical therapy including low-dose aspirin effective lipid-lowering treatment and antihypertensives to continue all these agents. Good lifestyle continue his daily 30-minute activity he reaches goal Recheck labs CMP lipid profile today continue his combined high intensity statin and Zetia.   Next appointment: 1 year   Medication Adjustments/Labs and Tests Ordered: Current medicines are reviewed at length with the patient today.  Concerns regarding medicines are outlined above.  No orders of the defined types were placed in this encounter.  No orders of the defined types were placed in this encounter.   Chief Complaint  Patient presents with   Follow-up    History of Present Illness:    Benjamin Arrowsmith Krawiec Sr. is a 52 y.o. male with a hx of mild CAD right bundle branch block hypertension statin intolerance and hyperlipidemia. CCTA performed 10/27/2019 showed a calcium score relatively low at 5 67th percentile for age and sex normal aorta and very mild less than 25% plaque in the left main LAD and right coronary artery.  He was last seen 09/18/2021.  Compliance with diet, lifestyle and medications: Yes  He has made positive impact on his lifestyle he walks 30 minutes every morning. He tolerates his statin without muscle pain or weakness I asked him to get back in the habit of checking and recording his home blood pressure He has had no cardiovascular symptoms of edema shortness of breath chest pain palpitation or syncope He has chronic shoulder pain  especially at nighttime His LP(a) level was quite low on previous screening Past Medical History:  Diagnosis Date   Acute coronary syndrome ([place]) 11/09/2019   Anxiety    Chest pain 11/09/2019   GERD (gastroesophageal reflux disease)    Hyperlipidemia    Hypertension    Hypertension, essential 11/09/2019   Tobacco abuse 11/09/2019    Past Surgical History:  Procedure Laterality Date   KNEE ARTHROSCOPY Right     Current Medications: Current Meds  Medication Sig   aspirin EC 81 MG tablet Take 1 tablet (81 mg total) by mouth daily. Swallow whole.   atorvastatin (LIPITOR) 40 MG tablet TAKE 1 TABLET BY MOUTH ONCE DAILY   Cyanocobalamin (B-12) 1000 MCG CAPS Take 1,000 mcg by mouth daily.   esomeprazole (NEXIUM) 20 MG capsule Take 20 mg by mouth daily at 12 noon.   ezetimibe (ZETIA) 10 MG tablet TAKE 1 TABLET BY MOTUH ONCE DAILY   loratadine (CLARITIN) 10 MG tablet Take 10 mg by mouth daily.   telmisartan-hydrochlorothiazide (MICARDIS HCT) 40-12.5 MG tablet TAKE ONE (1) TABLET ONCE DAILY     Allergies:   Patient has no known allergies.   Social History   Socioeconomic History   Marital status: Married    Spouse name: Not on file   Number of children: Not on file   Years of education: Not on file   Highest education level: Not on file  Occupational History   Not on file  Tobacco Use  Smoking status: Every Day    Passive exposure: Current   Smokeless tobacco: Never  Vaping Use   Vaping Use: Never used  Substance and Sexual Activity   Alcohol use: Not on file   Drug use: Never   Sexual activity: Not on file  Other Topics Concern   Not on file  Social History Narrative   Not on file   Social Determinants of Health   Financial Resource Strain: Not on file  Food Insecurity: Not on file  Transportation Needs: Not on file  Physical Activity: Not on file  Stress: Not on file  Social Connections: Not on file     Family History: The patient's family history includes CAD in  his father; Hypertension in his father and mother. ROS:   Please see the history of present illness.    All other systems reviewed and are negative.  EKGs/Labs/Other Studies Reviewed:    The following studies were reviewed today:  EKG:  EKG ordered today and personally reviewed.  The ekg ordered today demonstrates sinus rhythm remains normal  Recent Labs: 09/16/2021: BUN 16; Creatinine, Ser 0.80; Potassium 3.9; Sodium 142  Recent Lipid Panel    Component Value Date/Time   CHOL 121 09/16/2021 0834   TRIG 133 09/16/2021 0834   HDL 31 (L) 09/16/2021 0834   CHOLHDL 3.9 09/16/2021 0834   LDLCALC 66 09/16/2021 0834    Physical Exam:    VS:  BP 110/80 (BP Location: Right Arm, Patient Position: Sitting, Cuff Size: Normal)   Pulse 68   Ht 5' 10"$  (1.778 m)   Wt 231 lb (104.8 kg)   SpO2 96%   BMI 33.15 kg/m     Wt Readings from Last 3 Encounters:  08/19/22 231 lb (104.8 kg)  09/18/21 232 lb (105.2 kg)  04/03/21 231 lb (104.8 kg)     GEN:  Well nourished, well developed in no acute distress HEENT: Normal NECK: No JVD; No carotid bruits LYMPHATICS: No lymphadenopathy CARDIAC: RRR, no murmurs, rubs, gallops RESPIRATORY:  Clear to auscultation without rales, wheezing or rhonchi  ABDOMEN: Soft, non-tender, non-distended MUSCULOSKELETAL:  No edema; No deformity  SKIN: Warm and dry NEUROLOGIC:  Alert and oriented x 3 PSYCHIATRIC:  Normal affect   Seen with Simonne Martinet, CMA chaperone   Signed, Shirlee More, MD  08/19/2022 9:19 AM    [place]

## 2022-08-19 ENCOUNTER — Encounter: Payer: Self-pay | Admitting: Cardiology

## 2022-08-19 ENCOUNTER — Ambulatory Visit: Payer: Self-pay | Attending: Cardiology | Admitting: Cardiology

## 2022-08-19 VITALS — BP 110/80 | HR 68 | Ht 70.0 in | Wt 231.0 lb

## 2022-08-19 DIAGNOSIS — I2510 Atherosclerotic heart disease of native coronary artery without angina pectoris: Secondary | ICD-10-CM

## 2022-08-19 DIAGNOSIS — E782 Mixed hyperlipidemia: Secondary | ICD-10-CM

## 2022-08-19 DIAGNOSIS — I10 Essential (primary) hypertension: Secondary | ICD-10-CM

## 2022-08-19 DIAGNOSIS — I4510 Unspecified right bundle-branch block: Secondary | ICD-10-CM

## 2022-08-19 NOTE — Discharge Summary (Signed)
Examination chaperoned by Lowella Grip, CMA.

## 2022-08-19 NOTE — Addendum Note (Signed)
Addended by: Edwyna Shell I on: 08/19/2022 09:35 AM   Modules accepted: Orders

## 2022-08-19 NOTE — Patient Instructions (Signed)
Medication Instructions:  Your physician recommends that you continue on your current medications as directed. Please refer to the Current Medication list given to you today.  *If you need a refill on your cardiac medications before your next appointment, please call your pharmacy*   Lab Work: Your physician recommends that you return for lab work in:   Labs today: CMP, Lipid  If you have labs (blood work) drawn today and your tests are completely normal, you will receive your results only by: [place] (if you have [place]) OR A paper copy in the mail If you have any lab test that is abnormal or we need to change your treatment, we will call you to review the results.   Testing/Procedures: None   Follow-Up: At [facility], you and your health needs are our priority.  As part of our continuing mission to provide you with exceptional heart care, we have created designated Provider Care Teams.  These Care Teams include your primary Cardiologist (physician) and Advanced Practice Providers (APPs -  Physician Assistants and Nurse Practitioners) who all work together to provide you with the care you need, when you need it.  We recommend signing up for the patient portal called "MyChart".  Sign up information is provided on this After Visit Summary.  MyChart is used to connect with patients for Virtual Visits (Telemedicine).  Patients are able to view lab/test results, encounter notes, upcoming appointments, etc.  Non-urgent messages can be sent to your provider as well.   To learn more about what you can do with MyChart, go to [email or web address].    Your next appointment:   1 year(s)  Provider:   Shirlee More, MD    Other Instructions None

## 2022-08-20 LAB — COMPREHENSIVE METABOLIC PANEL
ALT: 32 IU/L (ref 0–44)
AST: 21 IU/L (ref 0–40)
Albumin/Globulin Ratio: 2 (ref 1.2–2.2)
Albumin: 4.6 g/dL (ref 3.8–4.9)
Alkaline Phosphatase: 102 IU/L (ref 44–121)
BUN/Creatinine Ratio: 27 — ABNORMAL HIGH (ref 9–20)
BUN: 19 mg/dL (ref 6–24)
Bilirubin Total: 0.5 mg/dL (ref 0.0–1.2)
CO2: 23 mmol/L (ref 20–29)
Calcium: 9.1 mg/dL (ref 8.7–10.2)
Chloride: 101 mmol/L (ref 96–106)
Creatinine, Ser: 0.71 mg/dL — ABNORMAL LOW (ref 0.76–1.27)
Globulin, Total: 2.3 g/dL (ref 1.5–4.5)
Glucose: 104 mg/dL — ABNORMAL HIGH (ref 70–99)
Potassium: 4 mmol/L (ref 3.5–5.2)
Sodium: 140 mmol/L (ref 134–144)
Total Protein: 6.9 g/dL (ref 6.0–8.5)
eGFR: 110 mL/min/{1.73_m2} (ref >59)

## 2022-08-20 LAB — LIPID PANEL
Chol/HDL Ratio: 3.4 ratio (ref 0.0–5.0)
Cholesterol, Total: 129 mg/dL (ref 100–199)
HDL: 38 mg/dL — ABNORMAL LOW (ref >39)
LDL Chol Calc (NIH): 77 mg/dL (ref 0–99)
Triglycerides: 71 mg/dL (ref 0–149)
VLDL Cholesterol Cal: 14 mg/dL (ref 5–40)

## 2022-09-07 ENCOUNTER — Other Ambulatory Visit: Payer: Self-pay | Admitting: Cardiology

## 2022-10-28 ENCOUNTER — Telehealth: Payer: Self-pay | Admitting: Cardiology

## 2022-10-28 NOTE — Telephone Encounter (Signed)
Pt c/o BP issue: STAT if pt c/o blurred vision, one-sided weakness or slurred speech  1. What are your last 5 BP readings? Ranging 150/90-95   2. Are you having any other symptoms (ex. Dizziness, headache, blurred vision, passed out)? No   3. What is your BP issue? Hypertension for the past two weeks   Patient is requesting an appt to be seen in regards to this.

## 2022-10-29 ENCOUNTER — Other Ambulatory Visit: Payer: Self-pay

## 2022-10-29 NOTE — Telephone Encounter (Signed)
Called the patient and he reported that his blood pressure had been running high for the past two weeks. His blood pressure has been running in the 150's/90's. This past morning his blood pressure 140/85. He states that he normally runs 120's / 80's for his blood pressure. He also reported that at times he has a little chest discomfort that comes and goes. Currently he has no chest discomfort, pressure or pain. In order for him to be evaluated I set-up an appointment for him to see Wallis Bamberg, NP on 10/30/22 at 10:15 am. Patient was agreeable with this plan and had no further questions at this time.

## 2022-10-29 NOTE — Progress Notes (Deleted)
Cardiology Office Note:    Date:  10/29/2022   ID:  Benjamin Goo Klingerman Sr., DOB 1970/09/07, MRN [id number]  PCP:  Erskine Emery, NP   [facility] Providers Cardiologist:  None { Click to update primary MD,subspecialty MD or APP then REFRESH:1}    Referring MD: Erskine Emery, NP   No chief complaint on file. ***  History of Present Illness:    Benjamin Lysaght Canepa Sr. is a 52 y.o. male with a hx of nonobstructive CAD per coronary CTA 2021, hypertension, GERD, hyperlipidemia, anxiety, tobacco abuse.  Coronary CTA on 10/27/2019 showed a calcium score of 5 placing him in the 67th percentile for age and sex, mild less than 25% plaque in the left main LAD and RCA.  Most recently he was evaluated by Dr. Dulce Sellar on 08/19/2022, at that time he was doing well and was to follow-up in a year.  At this visit his blood pressure was well-controlled at 110/80.  He called our triage line on 4/24 reporting that his blood pressure has been running higher for the last several days, chest gets discomfort that comes and goes.   Past Medical History:  Diagnosis Date   Acute coronary syndrome (HCC) 11/09/2019   Anxiety    Chest pain 11/09/2019   GERD (gastroesophageal reflux disease)    Hyperlipidemia    Hypertension    Hypertension, essential 11/09/2019   Tobacco abuse 11/09/2019    Past Surgical History:  Procedure Laterality Date   KNEE ARTHROSCOPY Right     Current Medications: No outpatient medications have been marked as taking for the 10/31/22 encounter (Appointment) with Flossie Dibble, NP.     Allergies:   Patient has no known allergies.   Social History   Socioeconomic History   Marital status: Married    Spouse name: Not on file   Number of children: Not on file   Years of education: Not on file   Highest education level: Not on file  Occupational History   Not on file  Tobacco Use   Smoking status: Every Day    Passive exposure: Current   Smokeless tobacco: Never   Vaping Use   Vaping Use: Never used  Substance and Sexual Activity   Alcohol use: Not on file   Drug use: Never   Sexual activity: Not on file  Other Topics Concern   Not on file  Social History Narrative   Not on file   Social Determinants of Health   Financial Resource Strain: Not on file  Food Insecurity: Not on file  Transportation Needs: Not on file  Physical Activity: Not on file  Stress: Not on file  Social Connections: Not on file     Family History: The patient's ***family history includes CAD in his father; Hypertension in his father and mother.  ROS:   Please see the history of present illness.    *** All other systems reviewed and are negative.  EKGs/Labs/Other Studies Reviewed:    The following studies were reviewed today: ***  EKG:  EKG is *** ordered today.  The ekg ordered today demonstrates ***  Recent Labs: 08/19/2022: ALT 32; BUN 19; Creatinine, Ser 0.71; Potassium 4.0; Sodium 140  Recent Lipid Panel    Component Value Date/Time   CHOL 129 08/19/2022 0941   TRIG 71 08/19/2022 0941   HDL 38 (L) 08/19/2022 0941   CHOLHDL 3.4 08/19/2022 0941   LDLCALC 77 08/19/2022 0941     Risk Assessment/Calculations:   {  Does this patient have ATRIAL FIBRILLATION?:[phone]}  No BP recorded.  {Refresh Note OR Click here to enter BP  :1}***         Physical Exam:    VS:  There were no vitals taken for this visit.    Wt Readings from Last 3 Encounters:  08/19/22 231 lb (104.8 kg)  09/18/21 232 lb (105.2 kg)  04/03/21 231 lb (104.8 kg)     GEN: *** Well nourished, well developed in no acute distress HEENT: Normal NECK: No JVD; No carotid bruits LYMPHATICS: No lymphadenopathy CARDIAC: ***RRR, no murmurs, rubs, gallops RESPIRATORY:  Clear to auscultation without rales, wheezing or rhonchi  ABDOMEN: Soft, non-tender, non-distended MUSCULOSKELETAL:  No edema; No deformity  SKIN: Warm and dry NEUROLOGIC:  Alert and oriented x 3 PSYCHIATRIC:   Normal affect   ASSESSMENT:    1. Mild CAD   2. Essential hypertension   3. Mixed hyperlipidemia    PLAN:    In order of problems listed above:  ***      {Are you ordering a CV Procedure (e.g. stress test, cath, DCCV, TEE, etc)?   Press F2        :[id number]}    Medication Adjustments/Labs and Tests Ordered: Current medicines are reviewed at length with the patient today.  Concerns regarding medicines are outlined above.  No orders of the defined types were placed in this encounter.  No orders of the defined types were placed in this encounter.   There are no Patient Instructions on file for this visit.   Signed, Flossie Dibble, NP  10/29/2022 6:46 PM    [facility]

## 2022-10-30 ENCOUNTER — Ambulatory Visit: Payer: Self-pay | Attending: Cardiology | Admitting: Cardiology

## 2022-10-30 ENCOUNTER — Encounter: Payer: Self-pay | Admitting: Cardiology

## 2022-10-30 VITALS — BP 158/96 | HR 65 | Ht 70.0 in | Wt 228.0 lb

## 2022-10-30 DIAGNOSIS — K219 Gastro-esophageal reflux disease without esophagitis: Secondary | ICD-10-CM

## 2022-10-30 DIAGNOSIS — E782 Mixed hyperlipidemia: Secondary | ICD-10-CM

## 2022-10-30 DIAGNOSIS — Z720 Tobacco use: Secondary | ICD-10-CM

## 2022-10-30 DIAGNOSIS — I2510 Atherosclerotic heart disease of native coronary artery without angina pectoris: Secondary | ICD-10-CM

## 2022-10-30 DIAGNOSIS — I10 Essential (primary) hypertension: Secondary | ICD-10-CM

## 2022-10-30 NOTE — Progress Notes (Signed)
Cardiology Office Note:    Date:  10/30/2022   ID:  Benjamin Goo Menken Sr., DOB June 20, 1971, MRN [id number]  PCP:  Abner Greenspan, MD   [facility] Providers Cardiologist:  Norman Herrlich, MD     Referring MD: Erskine Emery, NP   CC: Follow-up chest pain  History of Present Illness:    Benjamin Thum Nicklin Sr. is a 52 y.o. male with a hx of mild nonobstructive CAD, GERD, HTN, tobacco abuse, HLD, RBBB.   He establish care with Dr. Dulce Sellar in 2021 with atypical chest pain and hypertension.  He had a coronary CTA on 09/29/2019 which revealed mild calcium in the proximal left main/RCA/LAD.  Most recently he was evaluated by Dr. Dulce Sellar on 08/19/2022 at that time he was doing well from a cardiac perspective, blood pressure was well-controlled.  He presents today for concerns of chest pain, elevated blood pressure readings.  He has recently stopped smoking and has started to vape and notices blood pressure trending up.  He checks his blood pressure very times throughout the day so is hard to determine what his blood pressure readings at home are however this morning he states when he first woke up it was 130/80.  In the office today it is elevated at 160/90 however he typically takes his blood pressure medication at noon and his visit is at 11:00.  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  He endorses an occasional sensation in his chest, in his esophagus area, and directly under his chin.  He states he feels like this area is swollen when he lowers his chin to his chest.  He felt this was related to his Nexium so he stopped this a few days ago and has noticed the sensation is gone away.  At times he endorses pain over his esophagus, if he is able to reach the center of his back he is able to press in that area and relieve the sensation. He denies chest pain, palpitations, dyspnea, pnd, orthopnea, n, v, dizziness, syncope, edema, weight gain, or early satiety.    Past Medical  History:  Diagnosis Date   Acute coronary syndrome 11/09/2019   Anxiety    Chest pain 11/09/2019   GERD (gastroesophageal reflux disease)    Hyperlipidemia    Hypertension    Hypertension, essential 11/09/2019   Tobacco abuse 11/09/2019    Past Surgical History:  Procedure Laterality Date   KNEE ARTHROSCOPY Right     Current Medications: Current Meds  Medication Sig   aspirin EC 81 MG tablet Take 1 tablet (81 mg total) by mouth daily. Swallow whole.   atorvastatin (LIPITOR) 40 MG tablet TAKE 1 TABLET BY MOUTH ONCE DAILY   Coenzyme Q10 (COQ10) 100 MG CAPS Take 100 mg by mouth daily.   Cyanocobalamin (B-12) 1000 MCG CAPS Take 1,000 mcg by mouth daily.   ezetimibe (ZETIA) 10 MG tablet TAKE 1 TABLET BY MOTUH ONCE DAILY   loratadine (CLARITIN) 10 MG tablet Take 10 mg by mouth daily.   montelukast (SINGULAIR) 10 MG tablet Take 10 mg by mouth at bedtime.   telmisartan-hydrochlorothiazide (MICARDIS HCT) 40-12.5 MG tablet TAKE 1 TABLET BY MOUTH DAILY     Allergies:   Patient has no known allergies.   Social History   Socioeconomic History   Marital status: Married    Spouse name: Not on file   Number of children: Not on file   Years of education: Not on file   Highest  education level: Not on file  Occupational History   Not on file  Tobacco Use   Smoking status: Former    Types: Cigarettes    Quit date: 08/2022    Years since quitting: 0.2    Passive exposure: Current   Smokeless tobacco: Never  Vaping Use   Vaping Use: Never used  Substance and Sexual Activity   Alcohol use: Yes    Alcohol/week: 6.0 - 8.0 standard drinks of alcohol    Types: 6 - 8 Cans of beer per week   Drug use: Never   Sexual activity: Not on file  Other Topics Concern   Not on file  Social History Narrative   Not on file   Social Determinants of Health   Financial Resource Strain: Not on file  Food Insecurity: Not on file  Transportation Needs: Not on file  Physical Activity: Not on file   Stress: Not on file  Social Connections: Not on file     Family History: The patient's family history includes CAD in his father; Hypertension in his father and mother.  ROS:   Please see the history of present illness.     All other systems reviewed and are negative.  EKGs/Labs/Other Studies Reviewed:    The following studies were reviewed today: Cardiac Studies & Procedures          CT SCANS  CT CORONARY MORPH W/CTA COR W/SCORE 10/28/2019  Addendum 10/28/2019  7:40 AM ADDENDUM REPORT: 10/28/2019 07:37  EXAM: OVER-READ INTERPRETATION  CT CHEST  The following report is an over-read performed by radiologist Dr. Royal Piedra [facility] on 10/28/2019. This over-read does not include interpretation of cardiac or coronary anatomy or pathology. The coronary calcium scoring cardiac CT exam interpretation by the cardiologist is attached.  COMPARISON:  None.  FINDINGS: Within the visualized portions of the thorax there are no suspicious appearing pulmonary nodules or masses, there is no acute consolidative airspace disease, no pleural effusions, no pneumothorax and no lymphadenopathy. Visualized portions of the upper abdomen are unremarkable. There are no aggressive appearing lytic or blastic lesions noted in the visualized portions of the skeleton.  IMPRESSION: No significant incidental noncardiac findings are noted.   Electronically Signed By: Trudie Reed M.D. On: 10/28/2019 07:37  Narrative CLINICAL DATA:  Chest pain  EXAM: Cardiac CTA  MEDICATIONS: Sub lingual nitro.  4mg   TECHNIQUE: The patient was scanned on a Siemens Force 192 slice scanner. Gantry rotation speed was 250 msecs. Collimation was .6 mm. A 100 kV prospective scan was triggered in the ascending thoracic aorta at 140 HU's Full mA was used between 35% and 75% of the R-R interval. Average HR during the scan was 55 bpm. The 3D data set was interpreted on a dedicated work  station using MPR, MIP and VRT modes. A total of 80 cc of contrast was used.  FINDINGS: Non-cardiac: See separate report from [facility]. No significant findings on limited lung and soft tissue windows.  Calcium Score: Mild punctate calcium noted in proximal  LM/RCA/LAD  Coronary Arteries: Right dominant with no anomalies  LM: 1-24% calcific plaque at ostium  LAD: 1-24% mixed plaque in the mid LAD at septal take off  D1: Normal  D2: Normal  Circumflex: Normal  OM1: Normal  OM2: Normal  OM3: Normal  RCA: 1-24% calcified plaque in mid RCA  PDA: Normal  PLA: Norma  IMPRESSION: 1. Calcium score 5 which is 59 th percentile for age and sex  2.  Normal aortic root diameter 3.1 cm  3.  CAD RADS 1 non obstructive CAD see description above  Charlton Haws  Electronically Signed: By: Charlton Haws M.D. On: 10/27/2019 16:46           EKG:  EKG is  ordered today.  The ekg ordered today demonstrates SR, HR 65 bpm, consistent with prior EKG tracings.  Recent Labs: 08/19/2022: ALT 32; BUN 19; Creatinine, Ser 0.71; Potassium 4.0; Sodium 140  Recent Lipid Panel    Component Value Date/Time   CHOL 129 08/19/2022 0941   TRIG 71 08/19/2022 0941   HDL 38 (L) 08/19/2022 0941   CHOLHDL 3.4 08/19/2022 0941   LDLCALC 77 08/19/2022 0941     Risk Assessment/Calculations:      HYPERTENSION CONTROL Vitals:   10/30/22 1035 10/30/22 1159  BP: (!) 160/90 (!) 158/96    The patient's blood pressure is elevated above target today.  In order to address the patient's elevated BP: Blood pressure will be monitored at home to determine if medication changes need to be made.            Physical Exam:    VS:  BP (!) 158/96   Pulse 65   Ht 5\' 10"  (1.778 m)   Wt 228 lb (103.4 kg)   SpO2 99%   BMI 32.71 kg/m     Wt Readings from Last 3 Encounters:  10/30/22 228 lb (103.4 kg)  08/19/22 231 lb (104.8 kg)  09/18/21 232 lb (105.2 kg)     GEN:  Well nourished, well  developed in no acute distress HEENT: Normal NECK: No JVD; No carotid bruits LYMPHATICS: No lymphadenopathy CARDIAC: RRR, no murmurs, rubs, gallops RESPIRATORY:  Clear to auscultation without rales, wheezing or rhonchi  ABDOMEN: Soft, non-tender, non-distended MUSCULOSKELETAL:  No edema; No deformity  SKIN: Warm and dry NEUROLOGIC:  Alert and oriented x 3 PSYCHIATRIC:  Normal affect   ASSESSMENT:    1. Mild CAD   2. Essential hypertension   3. Mixed hyperlipidemia   4. Gastroesophageal reflux disease, unspecified whether esophagitis present   5. Vapes nicotine containing substance    PLAN:    In order of problems listed above:  CAD-mild nonobstructive CAD per coronary CTA in 2021. Stable with no anginal symptoms. No indication for ischemic evaluation.  Continue aspirin 81 mg daily, continue Lipitor 40 mg daily, continue Zetia 10 mg daily. Hypertension-blood pressure is elevated today at 160/90 however he has not taken his daily medications yet.  Blood pressure at home when he first woke up was 130/80.  We discussed proper blood pressure measurement techniques, avoiding stimulants, resting prior to taking his blood pressure.  For the next 2 weeks he will check his blood pressure using these techniques and call into our office and/or send them to [place] via MyChart.  Continue Micardis 40-12.5 mg.  If blood pressure continues to be elevated, will plan to increase to Micardis 80-12.5 mg daily. Hyperlipidemia-most recent LDL was 77, continue Lipitor 40 mg daily, continue Zetia 10 mg daily, ideally would like this to be less than 70 however he and his wife are making healthy lifestyle changes related to their dietary intake as he was told he was prediabetic at his most recent PCP visit. GERD-symptoms he describe sound to be most related to acid reflux.  He felt the Nexium was causing a swelling sensation under his chin.  He was previously on something that worked very well however he cannot recall  what it is.  Encouraged him to call his pharmacist and see what he was previously on and let his PCP know. Vaping -he has recently stopped smoking tobacco however he has replaced with vaping and feels like the nicotine dose is higher.  Encouraged him to stop this.  He advises he was going to stop today for concerns that was affecting his blood pressure.  Discussed Wellbutrin, or Chantix as needed to help with cravings however he feels he can do this on his own.  Disposition-monitor blood pressure x 2 weeks and report readings to office and or via MyChart.  Return in 6 months, sooner if needed.           Medication Adjustments/Labs and Tests Ordered: Current medicines are reviewed at length with the patient today.  Concerns regarding medicines are outlined above.  Orders Placed This Encounter  Procedures   EKG 12-Lead   No orders of the defined types were placed in this encounter.   Patient Instructions  Please keep a log for 2 weeks and send by MyChart.   Blood Pressure Record Sheet To take your blood pressure, you will need a blood pressure machine. You can buy a blood pressure machine (blood pressure monitor) at your clinic, drug store, or online. When choosing one, consider: An automatic monitor that has an arm cuff. A cuff that wraps snugly around your upper arm. You should be able to fit only one finger between your arm and the cuff. A device that stores blood pressure reading results. Do not choose a monitor that measures your blood pressure from your wrist or finger. Follow your health care provider's instructions for how to take your blood pressure. To use this form: Get one reading in the morning (a.m.) 1-2 hours after you take any medicines. Get one reading in the evening (p.m.) before supper.   Blood pressure log Date: _______________________  a.m. _____________________(1st reading) HR___________            p.m. _____________________(2nd reading)  HR__________  Date: _______________________  a.m. _____________________(1st reading) HR___________            p.m. _____________________(2nd reading) HR__________  Date: _______________________  a.m. _____________________(1st reading) HR___________            p.m. _____________________(2nd reading) HR__________  Date: _______________________  a.m. _____________________(1st reading) HR___________            p.m. _____________________(2nd reading) HR__________  Date: _______________________  a.m. _____________________(1st reading) HR___________            p.m. _____________________(2nd reading) HR__________  Date: _______________________  a.m. _____________________(1st reading) HR___________            p.m. _____________________(2nd reading) HR__________  Date: _______________________  a.m. _____________________(1st reading) HR___________            p.m. _____________________(2nd reading) HR__________   This information is not intended to replace advice given to you by your health care provider. Make sure you discuss any questions you have with your health care provider. Document Revised: 10/12/2019 Document Reviewed: 10/12/2019 Elsevier Patient Education  2021 Elsevier Inc.   Medication Instructions:  Your physician recommends that you continue on your current medications as directed. Please refer to the Current Medication list given to you today.  *If you need a refill on your cardiac medications before your next appointment, please call your pharmacy*   Lab Work: None ordered If you have labs (blood work) drawn today and your tests are completely normal, you will receive your results only by: MyChart Message (if you  have MyChart) OR A paper copy in the mail If you have any lab test that is abnormal or we need to change your treatment, we will call you to review the results.   Testing/Procedures: None ordered   Follow-Up: At [facility], you  and your health needs are our priority.  As part of our continuing mission to provide you with exceptional heart care, we have created designated Provider Care Teams.  These Care Teams include your primary Cardiologist (physician) and Advanced Practice Providers (APPs -  Physician Assistants and Nurse Practitioners) who all work together to provide you with the care you need, when you need it.  We recommend signing up for the patient portal called "MyChart".  Sign up information is provided on this After Visit Summary.  MyChart is used to connect with patients for Virtual Visits (Telemedicine).  Patients are able to view lab/test results, encounter notes, upcoming appointments, etc.  Non-urgent messages can be sent to your provider as well.   To learn more about what you can do with MyChart, go to [email or web address].    Your next appointment:   6 month(s)  The format for your next appointment:   In Person  Provider:   Norman Herrlich, MD    Other Instructions none  Important Information About Sugar        Signed, Flossie Dibble, NP  10/30/2022 12:13 PM    [facility]

## 2022-10-30 NOTE — Patient Instructions (Addendum)
Please keep a log for 2 weeks and send by MyChart.   Blood Pressure Record Sheet To take your blood pressure, you will need a blood pressure machine. You can buy a blood pressure machine (blood pressure monitor) at your clinic, drug store, or online. When choosing one, consider: An automatic monitor that has an arm cuff. A cuff that wraps snugly around your upper arm. You should be able to fit only one finger between your arm and the cuff. A device that stores blood pressure reading results. Do not choose a monitor that measures your blood pressure from your wrist or finger. Follow your health care provider's instructions for how to take your blood pressure. To use this form: Get one reading in the morning (a.m.) 1-2 hours after you take any medicines. Get one reading in the evening (p.m.) before supper.   Blood pressure log Date: _______________________  a.m. _____________________(1st reading) HR___________            p.m. _____________________(2nd reading) HR__________  Date: _______________________  a.m. _____________________(1st reading) HR___________            p.m. _____________________(2nd reading) HR__________  Date: _______________________  a.m. _____________________(1st reading) HR___________            p.m. _____________________(2nd reading) HR__________  Date: _______________________  a.m. _____________________(1st reading) HR___________            p.m. _____________________(2nd reading) HR__________  Date: _______________________  a.m. _____________________(1st reading) HR___________            p.m. _____________________(2nd reading) HR__________  Date: _______________________  a.m. _____________________(1st reading) HR___________            p.m. _____________________(2nd reading) HR__________  Date: _______________________  a.m. _____________________(1st reading) HR___________            p.m. _____________________(2nd reading)  HR__________   This information is not intended to replace advice given to you by your health care provider. Make sure you discuss any questions you have with your health care provider. Document Revised: 10/12/2019 Document Reviewed: 10/12/2019 Elsevier Patient Education  2021 Elsevier Inc.   Medication Instructions:  Your physician recommends that you continue on your current medications as directed. Please refer to the Current Medication list given to you today.  *If you need a refill on your cardiac medications before your next appointment, please call your pharmacy*   Lab Work: None ordered If you have labs (blood work) drawn today and your tests are completely normal, you will receive your results only by: MyChart Message (if you have MyChart) OR A paper copy in the mail If you have any lab test that is abnormal or we need to change your treatment, we will call you to review the results.   Testing/Procedures: None ordered   Follow-Up: At [facility], you and your health needs are our priority.  As part of our continuing mission to provide you with exceptional heart care, we have created designated Provider Care Teams.  These Care Teams include your primary Cardiologist (physician) and Advanced Practice Providers (APPs -  Physician Assistants and Nurse Practitioners) who all work together to provide you with the care you need, when you need it.  We recommend signing up for the patient portal called "MyChart".  Sign up information is provided on this After Visit Summary.  MyChart is used to connect with patients for Virtual Visits (Telemedicine).  Patients are able to view lab/test results, encounter notes, upcoming appointments, etc.  Non-urgent messages can be sent to your provider as  well.   To learn more about what you can do with MyChart, go to [email or web address].    Your next appointment:   6 month(s)  The format for your next appointment:   In  Person  Provider:   Norman Herrlich, MD    Other Instructions none  Important Information About Sugar

## 2022-10-31 ENCOUNTER — Ambulatory Visit: Payer: Self-pay | Admitting: Cardiology

## 2022-10-31 DIAGNOSIS — I10 Essential (primary) hypertension: Secondary | ICD-10-CM

## 2022-10-31 DIAGNOSIS — E782 Mixed hyperlipidemia: Secondary | ICD-10-CM

## 2022-10-31 DIAGNOSIS — I2510 Atherosclerotic heart disease of native coronary artery without angina pectoris: Secondary | ICD-10-CM

## 2022-11-03 ENCOUNTER — Other Ambulatory Visit: Payer: Self-pay | Admitting: Cardiology

## 2023-05-21 ENCOUNTER — Telehealth: Payer: Self-pay | Admitting: Cardiology

## 2023-05-21 DIAGNOSIS — Z79899 Other long term (current) drug therapy: Secondary | ICD-10-CM

## 2023-05-21 NOTE — Telephone Encounter (Signed)
Patient wants to know if he will need to do lab work prior to his visit on 11/18.

## 2023-05-21 NOTE — Telephone Encounter (Signed)
Spoke with patient and he is aware labs have been ordered

## 2023-05-21 NOTE — Telephone Encounter (Signed)
Patient would like to know if he needs labs prior to appointment

## 2023-05-21 NOTE — Telephone Encounter (Signed)
Left voicemail to return call to office.

## 2023-05-21 NOTE — Telephone Encounter (Signed)
Patient returned RN's call. 

## 2023-05-22 ENCOUNTER — Other Ambulatory Visit: Payer: Self-pay

## 2023-05-22 ENCOUNTER — Telehealth: Payer: Self-pay | Admitting: Cardiology

## 2023-05-22 DIAGNOSIS — Z79899 Other long term (current) drug therapy: Secondary | ICD-10-CM

## 2023-05-22 NOTE — Telephone Encounter (Signed)
Follow Up:      Patient says he is returning a call from this morning. He did not know who called, it was not Richard.

## 2023-05-22 NOTE — Telephone Encounter (Signed)
Spoke with patient of Dr. Dulce Sellar. He is unsure who called him - no voicemail. He reports when he got off work his SBP was 270. He went to the hospital and they did not change any of his meds but SBP came down to 170. He is taking Micardis-HCT only for BP. He has an appt on 11/18 with Anson [place] NP.   Routed to [facility] [facility] [employer] for any follow up Not sure if this was a call r/t hospital records for which this writer does not have access

## 2023-05-23 LAB — LIPID PANEL
Chol/HDL Ratio: 3.7 ratio (ref 0.0–5.0)
Cholesterol, Total: 111 mg/dL (ref 100–199)
HDL: 30 mg/dL — ABNORMAL LOW (ref >39)
LDL Chol Calc (NIH): 61 mg/dL (ref 0–99)
Triglycerides: 103 mg/dL (ref 0–149)
VLDL Cholesterol Cal: 20 mg/dL (ref 5–40)

## 2023-05-23 LAB — COMPREHENSIVE METABOLIC PANEL
ALT: 37 [IU]/L (ref 0–44)
AST: 28 [IU]/L (ref 0–40)
Albumin: 4.4 g/dL (ref 3.8–4.9)
Alkaline Phosphatase: 114 [IU]/L (ref 44–121)
BUN/Creatinine Ratio: 18 (ref 9–20)
BUN: 15 mg/dL (ref 6–24)
Bilirubin Total: 0.7 mg/dL (ref 0.0–1.2)
CO2: 25 mmol/L (ref 20–29)
Calcium: 9.5 mg/dL (ref 8.7–10.2)
Chloride: 102 mmol/L (ref 96–106)
Creatinine, Ser: 0.82 mg/dL (ref 0.76–1.27)
Globulin, Total: 2.5 g/dL (ref 1.5–4.5)
Glucose: 99 mg/dL (ref 70–99)
Potassium: 3.8 mmol/L (ref 3.5–5.2)
Sodium: 140 mmol/L (ref 134–144)
Total Protein: 6.9 g/dL (ref 6.0–8.5)
eGFR: 106 mL/min/{1.73_m2} (ref >59)

## 2023-05-23 NOTE — Progress Notes (Signed)
 =' Cardiology Office Note:    Date:  05/25/2023   ID:  Benjamin Lin Lappe Sr., DOB 07-Apr-1971, MRN [id number]  PCP:  Trinidad Hun, MD   [facility] Providers Cardiologist:  [name], MD     Referring MD: Trinidad Hun, MD   CC: Follow-up chest pain  History of Present Illness:    Benjamin Applegate Talwar Sr. is a 52 y.o. male with a hx of mild nonobstructive CAD, GERD, HTN, tobacco abuse, HLD, RBBB.   Coronary CTA on 09/29/2019 which revealed mild calcium  in the proximal left main/RCA/LAD.   Evaluated by Dr. Leiter on 08/19/2022 at that time he was doing well from a cardiac perspective, blood pressure was well-controlled.  Evaluated on 10/30/2022 for an episode of chest pain and elevated blood pressure readings, he has been recently started vaping after stopping smoking and noticed his blood pressure was trending up.  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  We had him keep a blood pressure log for 2 weeks and advised him to follow-up in 6 months.  He was evaluated at [facility] emergency department on 05/21/2023 with concerns of high blood pressure, he had apparently not been taken his blood pressure medicine on a consistent basis but over the last 3 days he had been compliant.  Was 180/93 in the emergency department, potassium was 3.2, sodium 138, creatinine 0.60, troponins were negative, CBC was normal, EKG revealed normal sinus rhythm.   He presents today for follow-up of his hypertension accompanied by his wife.  He was recently evaluated in the emergency department after hypertensive crisis, he noted walking to the grocery store and did not feel quite right, EMS was called, but the fire department and EMS took his blood pressure and it was elevated at 270 systolic.  By the time he arrived in the emergency department it was around 170 systolic.  His wife feels like there is a strong component of anxiety playing a role in this as well. He denies chest pain,  palpitations, dyspnea, pnd, orthopnea, n, v, dizziness, syncope, edema, weight gain, or early satiety.   Past Medical History:  Diagnosis Date   Acute coronary syndrome (HCC) 11/09/2019   Anxiety    Chest pain 11/09/2019   GERD (gastroesophageal reflux disease)    Hyperlipidemia    Hypertension    Hypertension, essential 11/09/2019   Tobacco abuse 11/09/2019    Past Surgical History:  Procedure Laterality Date   KNEE ARTHROSCOPY Right     Current Medications: Current Meds  Medication Sig   aspirin  EC 81 MG tablet Take 1 tablet (81 mg total) by mouth daily. Swallow whole.   atorvastatin  (LIPITOR) 40 MG tablet Take 1 tablet (40 mg total) by mouth daily.   Coenzyme Q10 (COQ10) 100 MG CAPS Take 100 mg by mouth daily.   Cyanocobalamin (B-12) 1000 MCG CAPS Take 1,000 mcg by mouth daily.   ezetimibe  (ZETIA ) 10 MG tablet Take 1 tablet (10 mg total) by mouth daily.   loratadine (CLARITIN) 10 MG tablet Take 10 mg by mouth daily.   metoprolol  tartrate (LOPRESSOR ) 25 MG tablet Take 1 tablet (25 mg total) by mouth as needed (for systolic blood pressure > 180).   montelukast (SINGULAIR) 10 MG tablet Take 10 mg by mouth at bedtime.   pantoprazole (PROTONIX) 40 MG tablet Take 40 mg by mouth daily.   telmisartan -hydrochlorothiazide (MICARDIS  HCT) 80-12.5 MG tablet Take 1 tablet by mouth daily.   [DISCONTINUED] telmisartan -hydrochlorothiazide (MICARDIS  HCT) 40-12.5 MG  tablet TAKE 1 TABLET BY MOUTH DAILY     Allergies:   Patient has no known allergies.   Social History   Socioeconomic History   Marital status: Married    Spouse name: Not on file   Number of children: Not on file   Years of education: Not on file   Highest education level: Not on file  Occupational History   Not on file  Tobacco Use   Smoking status: Former    Current packs/day: 0.00    Types: Cigarettes    Quit date: 08/2022    Years since quitting: 0.7    Passive exposure: Current   Smokeless tobacco: Never  Vaping Use    Vaping status: Never Used  Substance and Sexual Activity   Alcohol use: Yes    Alcohol/week: 6.0 - 8.0 standard drinks of alcohol    Types: 6 - 8 Cans of beer per week   Drug use: Never   Sexual activity: Not on file  Other Topics Concern   Not on file  Social History Narrative   Not on file   Social Determinants of Health   Financial Resource Strain: Not on file  Food Insecurity: Not on file  Transportation Needs: Not on file  Physical Activity: Not on file  Stress: Not on file  Social Connections: Not on file     Family History: The patient's family history includes CAD in his father; Hypertension in his father and mother.  ROS:   Please see the history of present illness.     All other systems reviewed and are negative.  EKGs/Labs/Other Studies Reviewed:    The following studies were reviewed today: Cardiac Studies & Procedures          CT SCANS  CT CORONARY MORPH W/CTA COR W/SCORE 10/27/2019  Addendum 10/28/2019  7:40 AM ADDENDUM REPORT: 10/28/2019 [facility]  EXAM: OVER-READ INTERPRETATION  CT CHEST  The following report is an over-read performed by radiologist Dr. Toribio Cove [facility], Benjamin on 10/28/2019. This over-read does not include interpretation of cardiac or coronary anatomy or pathology. The coronary calcium  scoring cardiac CT exam interpretation by the cardiologist is attached.  COMPARISON:  None.  FINDINGS: Within the visualized portions of the thorax there are no suspicious appearing pulmonary nodules or masses, there is no acute consolidative airspace disease, no pleural effusions, no pneumothorax and no lymphadenopathy. Visualized portions of the upper abdomen are unremarkable. There are no aggressive appearing lytic or blastic lesions noted in the visualized portions of the skeleton.  IMPRESSION: No significant incidental noncardiac findings are noted.   Electronically Signed By: Benjamin Aye M.D. On: 10/28/2019  [facility]  Narrative CLINICAL DATA:  Chest pain  EXAM: Cardiac CTA  MEDICATIONS: Sub lingual nitro.  4mg   TECHNIQUE: The patient was scanned on a Siemens Force 192 slice scanner. Gantry rotation speed was 250 msecs. Collimation was .6 mm. A 100 kV prospective scan was triggered in the ascending thoracic aorta at 140 HU's Full mA was used between 35% and 75% of the R-R interval. Average HR during the scan was 55 bpm. The 3D data set was interpreted on a dedicated work station using MPR, MIP and VRT modes. A total of 80 cc of contrast was used.  FINDINGS: Non-cardiac: See separate report from [facility]. No significant findings on limited lung and soft tissue windows.  Calcium  Score: Mild punctate calcium  noted in proximal  LM/RCA/LAD  Coronary Arteries: Right dominant with no anomalies  LM: 1-24% calcific plaque  at ostium  LAD: 1-24% mixed plaque in the mid LAD at septal take off  D1: Normal  D2: Normal  Circumflex: Normal  OM1: Normal  OM2: Normal  OM3: Normal  RCA: 1-24% calcified plaque in mid RCA  PDA: Normal  PLA: Norma  IMPRESSION: 1. Calcium  score 5 which is 30 th percentile for age and sex  2.  Normal aortic root diameter 3.1 cm  3.  CAD RADS 1 non obstructive CAD see description above  Benjamin Douglas  Electronically Signed: By: Benjamin Douglas M.D. On: 10/27/2019 16:46           EKG:  EKG is  ordered today.  The ekg ordered today demonstrates SR, HR 65 bpm, consistent with prior EKG tracings.  Recent Labs: 05/22/2023: ALT 37; BUN 15; Creatinine, Ser 0.82; Potassium 3.8; Sodium 140  Recent Lipid Panel    Component Value Date/Time   CHOL 111 05/22/2023 0931   TRIG 103 05/22/2023 0931   HDL 30 (L) 05/22/2023 0931   CHOLHDL 3.7 05/22/2023 0931   LDLCALC 61 05/22/2023 0931     Risk Assessment/Calculations:      HYPERTENSION CONTROL Vitals:   05/25/23 0848 05/25/23 0944  BP: (!) 150/90 (!) 142/84    The patient's blood  pressure is elevated above target today.  In order to address the patient's elevated BP: A current anti-hypertensive medication was adjusted today.            Physical Exam:    VS:  BP (!) 142/84   Pulse 99   Ht 5' 10 (1.778 m)   Wt 221 lb (100.2 kg)   SpO2 99%   BMI 31.71 kg/m     Wt Readings from Last 3 Encounters:  05/25/23 221 lb (100.2 kg)  10/30/22 228 lb (103.4 kg)  08/19/22 231 lb (104.8 kg)     GEN:  Well nourished, well developed in no acute distress HEENT: Normal NECK: No JVD; No carotid bruits LYMPHATICS: No lymphadenopathy CARDIAC: RRR, no murmurs, rubs, gallops RESPIRATORY:  Clear to auscultation without rales, wheezing or rhonchi  ABDOMEN: Soft, non-tender, non-distended MUSCULOSKELETAL:  No edema; No deformity  SKIN: Warm and dry NEUROLOGIC:  Alert and oriented x 3 PSYCHIATRIC:  Normal affect   ASSESSMENT:    1. Coronary artery disease involving native coronary artery of native heart with angina pectoris (HCC)   2. Hypertension, essential   3. Mixed hyperlipidemia   4. Gastroesophageal reflux disease, unspecified whether esophagitis present   5. Anxiety about health   6. Tobacco use     PLAN:    In order of problems listed above:  CAD-mild nonobstructive CAD per coronary CTA in 2021. Stable with no anginal symptoms. No indication for ischemic evaluation.  Continue aspirin  81 mg daily, continue Lipitor 40 mg daily, continue Zetia  10 mg daily. Hypertension-recent ED evaluation for hypertensive crisis, blood pressure is elevated in the office today at 150/90 >> 142/84, he typically takes his Micardis  around noon.  We have previously had him keep a blood pressure log, it appears that his blood pressure readings are persistently elevated, we will increase his Micardis  to 80-12.5 mg daily.  Will also give him metoprolol  25 mg p.o. as needed for systolic heart rate greater than 180. Anxiety surrounding health-he has a strong family history of early CAD,  his blood pressure is elevated this makes him understandably anxious.  A beta-blocker will probably help most however he has heard unfavorable things about beta-blocker and was not interested in  taking anything consistently, will add a as needed dose for systolic readings greater than 180 per above. Hyperlipidemia--most recent LDL is well-controlled at 61, continue Lipitor 40 mg daily, continue Zetia  10 mg daily-as she cannot tolerate a higher dose of Lipitor secondary to myalgias. GERD-continue Protonix 40 mg daily Tobacco use-he formally stopped, vapes on a rare occasion, he may smoke a few cigarettes on the weekend when he is drinking.   Disposition-increase Micardis  to 80-12.5 mg daily, return in 2 weeks for BMET--also for nurse visit to check his blood pressure and validate his blood pressure cuff--his blood pressure does not to be well-controlled at this visit (white coat HTN) just to make sure that his blood pressure cuff is correlating with what we have in the office.  Follow-up in 6 months.       Medication Adjustments/Labs and Tests Ordered: Current medicines are reviewed at length with the patient today.  Concerns regarding medicines are outlined above.  Orders Placed This Encounter  Procedures   Basic Metabolic Panel (BMET)   Meds ordered this encounter  Medications   telmisartan -hydrochlorothiazide (MICARDIS  HCT) 80-12.5 MG tablet    Sig: Take 1 tablet by mouth daily.    Dispense:  90 tablet    Refill:  3   metoprolol  tartrate (LOPRESSOR ) 25 MG tablet    Sig: Take 1 tablet (25 mg total) by mouth as needed (for systolic blood pressure > 180).    Dispense:  90 tablet    Refill:  1    Patient Instructions  Medication Instructions:  Your physician has recommended you make the following change in your medication:   START: Micardis  80 /12.5 mg take 1 tablet daily START: Metoprolol  tartrate 25 mg as needed for systolic blood pressure > 180  *If you need a refill on your  cardiac medications before your next appointment, please call your pharmacy*   Lab Work: Your physician recommends that you return for lab work in:   Labs in 2 weeks: BMP  If you have labs (blood work) drawn today and your tests are completely normal, you will receive your results only by: MyChart Message (if you have MyChart) OR A paper copy in the mail If you have any lab test that is abnormal or we need to change your treatment, we will call you to review the results.   Testing/Procedures: None   Follow-Up: At [facility], you and your health needs are our priority.  As part of our continuing mission to provide you with exceptional heart care, we have created designated Provider Care Teams.  These Care Teams include your primary Cardiologist (physician) and Advanced Practice Providers (APPs -  Physician Assistants and Nurse Practitioners) who all work together to provide you with the care you need, when you need it.  We recommend signing up for the patient portal called MyChart.  Sign up information is provided on this After Visit Summary.  MyChart is used to connect with patients for Virtual Visits (Telemedicine).  Patients are able to view lab/test results, encounter notes, upcoming appointments, etc.  Non-urgent messages can be sent to your provider as well.   To learn more about what you can do with MyChart, go to [email or web address].    Your next appointment:   6 month(s)  Provider:   Delon Hoover, Benjamin Douglas [facility])    Other Instructions None    Signed, Benjamin JAYSON Hoover, Benjamin Douglas  05/25/2023 9:49 AM    [facility]

## 2023-05-25 ENCOUNTER — Ambulatory Visit: Payer: Self-pay | Attending: Cardiology | Admitting: Cardiology

## 2023-05-25 ENCOUNTER — Telehealth: Payer: Self-pay | Admitting: Cardiology

## 2023-05-25 ENCOUNTER — Encounter: Payer: Self-pay | Admitting: Cardiology

## 2023-05-25 VITALS — BP 142/84 | HR 99 | Ht 70.0 in | Wt 221.0 lb

## 2023-05-25 DIAGNOSIS — R4589 Other symptoms and signs involving emotional state: Secondary | ICD-10-CM

## 2023-05-25 DIAGNOSIS — E782 Mixed hyperlipidemia: Secondary | ICD-10-CM

## 2023-05-25 DIAGNOSIS — I10 Essential (primary) hypertension: Secondary | ICD-10-CM

## 2023-05-25 DIAGNOSIS — Z720 Tobacco use: Secondary | ICD-10-CM

## 2023-05-25 DIAGNOSIS — I25119 Atherosclerotic heart disease of native coronary artery with unspecified angina pectoris: Secondary | ICD-10-CM

## 2023-05-25 DIAGNOSIS — K219 Gastro-esophageal reflux disease without esophagitis: Secondary | ICD-10-CM

## 2023-05-25 MED ORDER — TELMISARTAN-HCTZ 80-12.5 MG PO TABS: 1.0000 | ORAL_TABLET | Freq: Every day | ORAL | 3 refills | Status: DC

## 2023-05-25 MED ORDER — METOPROLOL TARTRATE 25 MG PO TABS: 25.0000 mg | ORAL_TABLET | ORAL | 1 refills | Status: DC | PRN

## 2023-05-25 NOTE — Telephone Encounter (Signed)
Called patient and informed him of Benjamin Bamberg, NP message below:  "Labs look good, cholesterol well controlled. Start increased dose of new BP medicine like we discussed."  Patient verbalized understanding and had no further questions at this time.

## 2023-05-25 NOTE — Telephone Encounter (Signed)
Patient saw Wallis Bamberg, NP in the office today and his medications were adjusted to help control his blood pressure.

## 2023-05-25 NOTE — Patient Instructions (Signed)
Medication Instructions:  Your physician has recommended you make the following change in your medication:   START: Micardis 80 /12.5 mg take 1 tablet daily START: Metoprolol tartrate 25 mg as needed for systolic blood pressure > 180  *If you need a refill on your cardiac medications before your next appointment, please call your pharmacy*   Lab Work: Your physician recommends that you return for lab work in:   Labs in 2 weeks: BMP  If you have labs (blood work) drawn today and your tests are completely normal, you will receive your results only by: MyChart Message (if you have MyChart) OR A paper copy in the mail If you have any lab test that is abnormal or we need to change your treatment, we will call you to review the results.   Testing/Procedures: None   Follow-Up: At [facility], you and your health needs are our priority.  As part of our continuing mission to provide you with exceptional heart care, we have created designated Provider Care Teams.  These Care Teams include your primary Cardiologist (physician) and Advanced Practice Providers (APPs -  Physician Assistants and Nurse Practitioners) who all work together to provide you with the care you need, when you need it.  We recommend signing up for the patient portal called "MyChart".  Sign up information is provided on this After Visit Summary.  MyChart is used to connect with patients for Virtual Visits (Telemedicine).  Patients are able to view lab/test results, encounter notes, upcoming appointments, etc.  Non-urgent messages can be sent to your provider as well.   To learn more about what you can do with MyChart, go to [email or web address].    Your next appointment:   6 month(s)  Provider:   Wallis Bamberg, NP [facility])    Other Instructions None

## 2023-06-09 ENCOUNTER — Ambulatory Visit: Payer: Self-pay | Attending: Cardiology

## 2023-06-10 NOTE — Progress Notes (Signed)
   Nurse Visit   Date of Encounter: 06/10/2023 ID: Benjamin Douglas Sr., DOB [birth year], MRN [id number]  PCP:  Abner Greenspan, MD   [facility] Providers Cardiologist:  Norman Herrlich, MD      Visit Details   VS:  BP (!) 158/96 (BP Location: Left Arm, Patient Position: Sitting, Cuff Size: Normal)   Pulse 84   Wt 224 lb 6.4 oz (101.8 kg)   BMI 32.20 kg/m  , BMI Body mass index is 32.2 kg/m.  Wt Readings from Last 3 Encounters:  06/09/23 224 lb 6.4 oz (101.8 kg)  05/25/23 221 lb (100.2 kg)  10/30/22 228 lb (103.4 kg)     Reason for visit: Perform vitals. Performed today: Vitals, Provider consulted and Education Changes (medications, testing, etc.) : Check blood pressure daily and turn in a blood pressure log to the office in 2 weeks. Length of Visit: 25 minutes    Medications Adjustments/Labs and Tests Ordered: No orders of the defined types were placed in this encounter.  No orders of the defined types were placed in this encounter.    Signed, Samson Frederic, RN  06/10/2023 8:16 AM

## 2023-06-17 ENCOUNTER — Ambulatory Visit: Payer: Self-pay | Admitting: Cardiology

## 2023-07-05 NOTE — Progress Notes (Signed)
 Cardiology Office Note:    Date:  07/07/2023   ID:  Benjamin Lin Leer Sr., DOB 04-28-1971, MRN [id number]  PCP:  Trinidad Hun, MD  Cardiologist:  [name], MD    Referring MD: Trinidad Hun, MD    ASSESSMENT:    1. Hypertension, essential   2. Mixed hyperlipidemia   3. Coronary artery disease involving native coronary artery of native heart with angina pectoris (HCC)    PLAN:    In order of problems listed above:  Unusual course now having paroxysms of tachycardia hypertension flushing further evaluation for pheochromocytoma adrenal adenoma renal vascular hypertension add carvedilol  to achieve BP goals He feels badly and he will withdraw his a atorvastatin  Stable CAD   Next appointment: 6 weeks   Medication Adjustments/Labs and Tests Ordered: Current medicines are reviewed at length with the patient today.  Concerns regarding medicines are outlined above.  Orders Placed This Encounter  Procedures   EKG 12-Lead   No orders of the defined types were placed in this encounter.    History of Present Illness:    Benjamin Landress Purington Sr. is a 52 y.o. male with a hx of mild nonobstructive CAD hypertension hyperlipidemia right bundle branch block and tobacco abuse last seen 10/30/2022.  Compliance with diet, lifestyle and medications: Yes although he never started metoprolol   There is a lot going on he does not feel well he thinks the predominant problem is anxiety and neck pain He had paroxysms of hypertension heart racing and flushing and certainly is at risk for things like pheochromocytoma adrenal adenoma renal vascular hypertension He had an episode coming in the office blood pressure 152/74 heart rate initially 114 bpm EKG shows sinus rhythm I will continue his ARB thiazide place him on low-dose carvedilol  and then undergo evaluation for pheochromocytoma adrenal adenoma and renal artery stenosis. Past Medical History:  Diagnosis Date   Acute coronary syndrome (HCC)  11/09/2019   Anxiety    Chest pain 11/09/2019   GERD (gastroesophageal reflux disease)    Hyperlipidemia    Hypertension    Hypertension, essential 11/09/2019   Tobacco abuse 11/09/2019    Current Medications: Current Meds  Medication Sig   aspirin  EC 81 MG tablet Take 1 tablet (81 mg total) by mouth daily. Swallow whole.   atorvastatin  (LIPITOR) 40 MG tablet Take 1 tablet (40 mg total) by mouth daily.   Coenzyme Q10 (COQ10) 100 MG CAPS Take 100 mg by mouth daily.   Cyanocobalamin (B-12) 1000 MCG CAPS Take 1,000 mcg by mouth daily.   ezetimibe  (ZETIA ) 10 MG tablet Take 1 tablet (10 mg total) by mouth daily.   loratadine (CLARITIN) 10 MG tablet Take 10 mg by mouth daily.   metoprolol  tartrate (LOPRESSOR ) 25 MG tablet Take 1 tablet (25 mg total) by mouth as needed (for systolic blood pressure > 180).   montelukast (SINGULAIR) 10 MG tablet Take 10 mg by mouth at bedtime.   pantoprazole (PROTONIX) 40 MG tablet Take 40 mg by mouth daily.   telmisartan -hydrochlorothiazide (MICARDIS  HCT) 80-12.5 MG tablet Take 1 tablet by mouth daily.      EKGs/Labs/Other Studies Reviewed:    The following studies were reviewed today:  EKG Interpretation Date/Time:  Tuesday July 07 2023 14:49:21 EST Ventricular Rate:  87 PR Interval:  144 QRS Duration:  98 QT Interval:  372 QTC Calculation: 447 R Axis:   14  Text Interpretation: Sinus rhythm 1 PVC is present Possible Left atrial enlargement RSR' pattern in V1 No previous  ECGs available Confirmed by Monetta Rogue ([id number]) on 07/07/2023 2:54:51 PM   Recent Labs: 05/22/2023: ALT 37; BUN 15; Creatinine, Ser 0.82; Potassium 3.8; Sodium 140  Recent Lipid Panel    Component Value Date/Time   CHOL 111 05/22/2023 0931   TRIG 103 05/22/2023 0931   HDL 30 (L) 05/22/2023 0931   CHOLHDL 3.7 05/22/2023 0931   LDLCALC 61 05/22/2023 0931  EKG Interpretation Date/Time:  Tuesday July 07 2023 14:49:21 EST Ventricular Rate:  87 PR Interval:  144 QRS  Duration:  98 QT Interval:  372 QTC Calculation: 447 R Axis:   14  Text Interpretation: Sinus rhythm 1 PVC is present Possible Left atrial enlargement RSR' pattern in V1 No previous ECGs available Confirmed by Monetta Rogue ([id number]) on 07/07/2023 2:54:51 PM   EKG Interpretation Date/Time:  Tuesday July 07 2023 14:49:21 EST Ventricular Rate:  87 PR Interval:  144 QRS Duration:  98 QT Interval:  372 QTC Calculation: 447 R Axis:   14  Text Interpretation: Sinus rhythm 1 PVC is present Possible Left atrial enlargement RSR' pattern in V1 No previous ECGs available Confirmed by Monetta Rogue ([id number]) on 07/07/2023 2:54:51 PM   Physical Exam:    VS:  BP (!) 152/74   Pulse 86 Comment: on the ekg  Ht 5' 10 (1.778 m)   Wt 221 lb 3.2 oz (100.3 kg)   SpO2 98%   BMI 31.74 kg/m     Wt Readings from Last 3 Encounters:  07/07/23 221 lb 3.2 oz (100.3 kg)  06/09/23 224 lb 6.4 oz (101.8 kg)  05/25/23 221 lb (100.2 kg)     GEN: He is quite flushed well nourished, well developed in no acute distress HEENT: Normal NECK: No JVD; No carotid bruits LYMPHATICS: No lymphadenopathy CARDIAC: RRR, no murmurs, rubs, gallops RESPIRATORY:  Clear to auscultation without rales, wheezing or rhonchi  ABDOMEN: Soft, non-tender, non-distended MUSCULOSKELETAL:  No edema; No deformity  SKIN: Warm and dry NEUROLOGIC:  Alert and oriented x 3 PSYCHIATRIC:  Normal affect    Signed, Rogue Monetta, MD  07/07/2023 3:14 PM    [facility]

## 2023-07-07 ENCOUNTER — Ambulatory Visit: Payer: Self-pay | Attending: Cardiology | Admitting: Cardiology

## 2023-07-07 ENCOUNTER — Encounter: Payer: Self-pay | Admitting: Cardiology

## 2023-07-07 VITALS — BP 152/74 | HR 86 | Ht 70.0 in | Wt 221.2 lb

## 2023-07-07 DIAGNOSIS — I25119 Atherosclerotic heart disease of native coronary artery with unspecified angina pectoris: Secondary | ICD-10-CM

## 2023-07-07 DIAGNOSIS — E782 Mixed hyperlipidemia: Secondary | ICD-10-CM

## 2023-07-07 DIAGNOSIS — I10 Essential (primary) hypertension: Secondary | ICD-10-CM

## 2023-07-07 MED ORDER — CARVEDILOL 6.25 MG PO TABS: 6.2500 mg | ORAL_TABLET | Freq: Two times a day (BID) | ORAL | 3 refills | Status: DC

## 2023-07-07 NOTE — Addendum Note (Signed)
Addended by: Lonia Farber on: 07/07/2023 03:36 PM   Modules accepted: Orders

## 2023-07-07 NOTE — Patient Instructions (Addendum)
 Medication Instructions:    Stop taking Lipitor. Stop taking Metoprolol .   Start taking Carvedilol  6.25 mg twice a day   *If you need a refill on your cardiac medications before your next appointment, please call your pharmacy*   Lab Work: You will have Aldosterone/Renin level drawn today. You need to have a VMA 24 hour urine, cathecholamine urine.   If you have labs (blood work) drawn today and your tests are completely normal, you will receive your results only by: MyChart Message (if you have MyChart) OR A paper copy in the mail If you have any lab test that is abnormal or we need to change your treatment, we will call you to review the results.   Testing/Procedures: Your physician has requested that you have a renal artery duplex. During this test, an ultrasound is used to evaluate blood flow to the kidneys. Allow one hour for this exam. Do not eat after midnight the day before and avoid carbonated beverages. Take your medications as you usually do.  Non-Cardiac CT scanning, (CAT scanning), is a noninvasive, special x-ray that produces cross-sectional images of the body using x-rays and a computer. CT scans help physicians diagnose and treat medical conditions. For some CT exams, a contrast material is used to enhance visibility in the area of the body being studied. CT scans provide greater clarity and reveal more details than regular x-ray exams.     Follow-Up: At [facility], you and your health needs are our priority.  As part of our continuing mission to provide you with exceptional heart care, we have created designated Provider Care Teams.  These Care Teams include your primary Cardiologist (physician) and Advanced Practice Providers (APPs -  Physician Assistants and Nurse Practitioners) who all work together to provide you with the care you need, when you need it.  We recommend signing up for the patient portal called MyChart.  Sign up information is provided on this  After Visit Summary.  MyChart is used to connect with patients for Virtual Visits (Telemedicine).  Patients are able to view lab/test results, encounter notes, upcoming appointments, etc.  Non-urgent messages can be sent to your provider as well.   To learn more about what you can do with MyChart, go to [email or web address].    Your next appointment:   6 week Follow up with Dr. Monetta     Other Instructions [facility]  Tips to measure your blood pressure correctly  To determine whether you have hypertension, a medical professional will take a blood pressure reading. How you prepare for the test, the position of your arm, and other factors can change a blood pressure reading by 10% or more. That could be enough to hide high blood pressure, start you on a drug you don't really need, or lead your doctor to incorrectly adjust your medications. National and international guidelines offer specific instructions for measuring blood pressure. If a doctor, nurse, or medical assistant isn't doing it right, don't hesitate to ask him or her to get with the guidelines. Here's what you can do to ensure a correct reading:  Don't drink a caffeinated beverage or smoke during the 30 minutes before the test.  Sit quietly for five minutes before the test begins.  During the measurement, sit in a chair with your feet on the floor and your arm supported so your elbow is at about heart level.  The inflatable part of the cuff should completely cover at least 80% of your upper arm, and the  cuff should be placed on bare skin, not over a shirt.  Don't talk during the measurement.  Have your blood pressure measured twice, with a brief break in between. If the readings are different by 5 points or more, have it done a third time. There are times to break these rules. If you sometimes feel lightheaded when getting out of bed in the morning or when you stand after sitting, you should have your blood pressure checked  while seated and then while standing to see if it falls from one position to the next. Because blood pressure varies throughout the day, your doctor will rarely diagnose hypertension on the basis of a single reading. Instead, he or she will want to confirm the measurements on at least two occasions, usually within a few weeks of one another. The exception to this rule is if you have a blood pressure reading of 180/110 mm Hg or higher. A result this high usually calls for prompt treatment. It's also a good idea to have your blood pressure measured in both arms at least once, since the reading in one arm (usually the right) may be higher than that in the left. A 2014 study in The American Journal of Medicine of nearly 3,400 people found average arm- to-arm differences in systolic blood pressure of about 5 points. The higher number should be used to make treatment decisions. In 2017, new guidelines from the American Heart Association, the [employer] of Cardiology, and nine other health organizations lowered the diagnosis of high blood pressure to 130/80 mm Hg or higher for all adults. The guidelines also redefined the various blood pressure categories to now include normal, elevated, Stage 1 hypertension, Stage 2 hypertension, and hypertensive crisis (see Blood pressure categories). Blood pressure categories  Blood pressure category SYSTOLIC (upper number)  DIASTOLIC (lower number)  Normal Less than 120 mm Hg and Less than 80 mm Hg  Elevated 120-129 mm Hg and Less than 80 mm Hg  High blood pressure: Stage 1 hypertension 130-139 mm Hg or 80-89 mm Hg  High blood pressure: Stage 2 hypertension 140 mm Hg or higher or 90 mm Hg or higher  Hypertensive crisis (consult your doctor immediately) Higher than 180 mm Hg and/or Higher than 120 mm Hg  Source: American Heart Association and American Stroke Association. For more on getting your blood pressure under control, buy Controlling Your Blood Pressure, a  Special Health Report from [facility].

## 2023-07-14 LAB — ALDOSTERONE + RENIN ACTIVITY W/ RATIO
Aldos/Renin Ratio: 0.2 (ref 0.0–30.0)
Aldosterone: 4.4 ng/dL (ref 0.0–30.0)
Renin Activity, Plasma: 19.457 ng/mL/h — ABNORMAL HIGH (ref 0.167–5.380)

## 2023-07-15 ENCOUNTER — Telehealth (HOSPITAL_BASED_OUTPATIENT_CLINIC_OR_DEPARTMENT_OTHER): Payer: Self-pay | Admitting: Cardiology

## 2023-07-15 NOTE — Telephone Encounter (Signed)
 1/08- sched CT abd/pelvis on 1/20@10 :00 MCA/self-pay/djr

## 2023-07-27 ENCOUNTER — Ambulatory Visit (INDEPENDENT_AMBULATORY_CARE_PROVIDER_SITE_OTHER)
Admission: RE | Admit: 2023-07-27 | Discharge: 2023-07-27 | Disposition: A | Discharge status: Home / Self Care | Payer: Self-pay | Source: Ambulatory Visit | Attending: Cardiology | Admitting: Cardiology

## 2023-07-27 ENCOUNTER — Ambulatory Visit: Payer: Self-pay | Attending: Cardiology

## 2023-07-27 DIAGNOSIS — I10 Essential (primary) hypertension: Secondary | ICD-10-CM

## 2023-07-30 LAB — CATECHOLAMINE+VMA, 24-HR URINE
Dopamine , 24H Ur: 173 ug/(24.h) (ref 0–510)
Dopamine, Rand Ur: 173 ug/L
Epinephrine, 24H Ur: 4 ug/(24.h) (ref 0–20)
Epinephrine, Rand Ur: 4 ug/L
Norepinephrine, 24H Ur: 17 ug/(24.h) (ref 0–135)
Norepinephrine, Rand Ur: 17 ug/L
VMA, 24H Ur Adult: 2.5 mg/(24.h) (ref 0.0–7.5)
VMA, Urine: 2.5 mg/L

## 2023-08-17 NOTE — Progress Notes (Signed)
Cardiology Office Note:    Date:  08/18/2023   ID:  Benjamin Goo Baylock Sr., DOB 09-13-1970, MRN [id number]  PCP:  Abner Greenspan, MD  Cardiologist:  Norman Herrlich, MD    Referring MD: Abner Greenspan, MD    ASSESSMENT:    1. Hypertension, essential   2. Mixed hyperlipidemia   3. Aortic atherosclerosis (HCC)   4. Atherosclerosis of native coronary artery of native heart without angina pectoris    PLAN:    In order of problems listed above:  Extensive evaluation he has essential hypertension he is improved I think he is at a reasonable target following ambulatory blood pressures and continue combination ARB thiazide diuretic and Cardizem Strongly encouraged him for his hepatic steatosis and hypertension for moderate weight loss dietary restrictions and regular physical activity and is joined his wife when she does her walk every morning. He is on appropriate therapy with a high intensity statin with his family history low coronary calcium score and atherosclerosis of the CT scan. He seemed reassured after the above he is can follow-up with his PCP about hepatic steatosis   Next appointment: 6 months   Medication Adjustments/Labs and Tests Ordered: Current medicines are reviewed at length with the patient today.  Concerns regarding medicines are outlined above.  No orders of the defined types were placed in this encounter.  No orders of the defined types were placed in this encounter.    History of Present Illness:    Benjamin Scallon Boan Sr. is a 53 y.o. male with a hx of hypertension with paroxysms of flushing and tachycardia hyperlipidemia right bundle branch block mild nonobstructive CAD last seen 07/07/2023.   evaluation with increased renin activity 19.5 urinary catecholamines were normal.  CTA showed no evidence of adrenal tumor which showed findings of fatty steatosis of the liver.  Vascular duplex showed no findings of renal artery stenosis Compliance with diet, lifestyle and  medications: Yes  I thought he was here today to follow-up for his evaluation for secondary forms of hypertension He is compliant with his medications feels much better his blood pressures are improved and generally runs in the 1 30-1 40/70 to low 80s blood pressure range His wife is present compliant with medications she is doing the best she can to get him in a regular walking program and weight loss He surprised me by telling me that the CT of his abdomen showed blocked arteries in his chest Stopped when out pulled up his cardiac CTA he had done April 2021 with a relatively low coronary calcium score of 5 and some minimal atherosclerosis less than 25%.  He has been on long-term statin therapy And he tells me the arteries are plugged in his abdomen he had mild aortic atherosclerosis. I explained to him the difference between atherosclerosis and stenosis he has been on long-term statin therapy and I do not think he requires further vascular imaging He then asked me if I knew that he had cirrhosis.  I reviewed the report he had hepatic steatosis and had findings that could be nonspecific but may represent cirrhosis and fortunately his family doctor is doing further evaluation.  He is compliant with his medications he is not having angina palpitations syncope shortness of breath or edema.  He still flushes at times and he says he belches quite a bit I told him I did not have an easy answer for the symptoms. Past Medical History:  Diagnosis Date   Acute coronary syndrome (HCC) 11/09/2019  Anxiety    Chest pain 11/09/2019   GERD (gastroesophageal reflux disease)    Hyperlipidemia    Hypertension    Hypertension, essential 11/09/2019   Tobacco abuse 11/09/2019    Current Medications: Current Meds  Medication Sig   aspirin EC 81 MG tablet Take 1 tablet (81 mg total) by mouth daily. Swallow whole.   atorvastatin (LIPITOR) 40 MG tablet Take 40 mg by mouth daily.   carvedilol (COREG) 6.25 MG tablet  Take 1 tablet (6.25 mg total) by mouth 2 (two) times daily.   Coenzyme Q10 (COQ10) 100 MG CAPS Take 100 mg by mouth daily.   Cyanocobalamin (B-12) 1000 MCG CAPS Take 1,000 mcg by mouth daily.   ezetimibe (ZETIA) 10 MG tablet Take 1 tablet (10 mg total) by mouth daily.   loratadine (CLARITIN) 10 MG tablet Take 10 mg by mouth daily.   pantoprazole (PROTONIX) 40 MG tablet Take 40 mg by mouth daily.   telmisartan-hydrochlorothiazide (MICARDIS HCT) 80-12.5 MG tablet Take 1 tablet by mouth daily.      EKGs/Labs/Other Studies Reviewed:    The following studies were reviewed today:  Cardiac Studies & Procedures   ______________________________________________________________________________________________          CT SCANS  CT CORONARY MORPH W/CTA COR W/SCORE 10/27/2019  Addendum 10/28/2019  7:40 AM ADDENDUM REPORT: 10/28/2019 07:37  EXAM: OVER-READ INTERPRETATION  CT CHEST  The following report is an over-read performed by radiologist Dr. Royal Piedra [facility] on 10/28/2019. This over-read does not include interpretation of cardiac or coronary anatomy or pathology. The coronary calcium scoring cardiac CT exam interpretation by the cardiologist is attached.  COMPARISON:  None.  FINDINGS: Within the visualized portions of the thorax there are no suspicious appearing pulmonary nodules or masses, there is no acute consolidative airspace disease, no pleural effusions, no pneumothorax and no lymphadenopathy. Visualized portions of the upper abdomen are unremarkable. There are no aggressive appearing lytic or blastic lesions noted in the visualized portions of the skeleton.  IMPRESSION: No significant incidental noncardiac findings are noted.   Electronically Signed By: Trudie Reed M.D. On: 10/28/2019 07:37  Narrative CLINICAL DATA:  Chest pain  EXAM: Cardiac CTA  MEDICATIONS: Sub lingual nitro.  4mg   TECHNIQUE: The patient was scanned on a  Siemens Force 192 slice scanner. Gantry rotation speed was 250 msecs. Collimation was .6 mm. A 100 kV prospective scan was triggered in the ascending thoracic aorta at 140 HU's Full mA was used between 35% and 75% of the R-R interval. Average HR during the scan was 55 bpm. The 3D data set was interpreted on a dedicated work station using MPR, MIP and VRT modes. A total of 80 cc of contrast was used.  FINDINGS: Non-cardiac: See separate report from [facility]. No significant findings on limited lung and soft tissue windows.  Calcium Score: Mild punctate calcium noted in proximal  LM/RCA/LAD  Coronary Arteries: Right dominant with no anomalies  LM: 1-24% calcific plaque at ostium  LAD: 1-24% mixed plaque in the mid LAD at septal take off  D1: Normal  D2: Normal  Circumflex: Normal  OM1: Normal  OM2: Normal  OM3: Normal  RCA: 1-24% calcified plaque in mid RCA  PDA: Normal  PLA: Norma  IMPRESSION: 1. Calcium score 5 which is 48 th percentile for age and sex  2.  Normal aortic root diameter 3.1 cm  3.  CAD RADS 1 non obstructive CAD see description above  Charlton Haws  Electronically Signed: By:  Charlton Haws M.D. On: 10/27/2019 16:46     ______________________________________________________________________________________________          Recent Labs: 05/22/2023: ALT 37; BUN 15; Creatinine, Ser 0.82; Potassium 3.8; Sodium 140  Recent Lipid Panel    Component Value Date/Time   CHOL 111 05/22/2023 0931   TRIG 103 05/22/2023 0931   HDL 30 (L) 05/22/2023 0931   CHOLHDL 3.7 05/22/2023 0931   LDLCALC 61 05/22/2023 0931    Physical Exam:    VS:  BP (!) 171/98   Pulse 81   Ht 5\' 10"  (1.778 m)   Wt 219 lb 12.8 oz (99.7 kg)   SpO2 98%   BMI 31.54 kg/m     Wt Readings from Last 3 Encounters:  08/18/23 219 lb 12.8 oz (99.7 kg)  07/07/23 221 lb 3.2 oz (100.3 kg)  06/09/23 224 lb 6.4 oz (101.8 kg)     GEN:  Well nourished, well developed  in no acute distress HEENT: Normal NECK: No JVD; No carotid bruits LYMPHATICS: No lymphadenopathy CARDIAC: RRR, no murmurs, rubs, gallops RESPIRATORY:  Clear to auscultation without rales, wheezing or rhonchi  ABDOMEN: Soft, non-tender, non-distended MUSCULOSKELETAL:  No edema; No deformity  SKIN: Warm and dry NEUROLOGIC:  Alert and oriented x 3 PSYCHIATRIC:  Normal affect    Signed, Norman Herrlich, MD  08/18/2023 4:29 PM     [facility]

## 2023-08-18 ENCOUNTER — Ambulatory Visit: Payer: Self-pay | Attending: Cardiology | Admitting: Cardiology

## 2023-08-18 ENCOUNTER — Encounter: Payer: Self-pay | Admitting: Cardiology

## 2023-08-18 VITALS — BP 171/98 | HR 81 | Ht 70.0 in | Wt 219.8 lb

## 2023-08-18 DIAGNOSIS — I700 Atherosclerosis of aorta: Secondary | ICD-10-CM

## 2023-08-18 DIAGNOSIS — E782 Mixed hyperlipidemia: Secondary | ICD-10-CM

## 2023-08-18 DIAGNOSIS — I2510 Atherosclerotic heart disease of native coronary artery without angina pectoris: Secondary | ICD-10-CM

## 2023-08-18 DIAGNOSIS — I10 Essential (primary) hypertension: Secondary | ICD-10-CM

## 2023-08-18 NOTE — Patient Instructions (Addendum)
Medication Instructions:  Your physician recommends that you continue on your current medications as directed. Please refer to the Current Medication list given to you today.  *If you need a refill on your cardiac medications before your next appointment, please call your pharmacy*   Lab Work: None If you have labs (blood work) drawn today and your tests are completely normal, you will receive your results only by: MyChart Message (if you have MyChart) OR A paper copy in the mail If you have any lab test that is abnormal or we need to change your treatment, we will call you to review the results.   Testing/Procedures: None   Follow-Up: At [facility], you and your health needs are our priority.  As part of our continuing mission to provide you with exceptional heart care, we have created designated Provider Care Teams.  These Care Teams include your primary Cardiologist (physician) and Advanced Practice Providers (APPs -  Physician Assistants and Nurse Practitioners) who all work together to provide you with the care you need, when you need it.  We recommend signing up for the patient portal called "MyChart".  Sign up information is provided on this After Visit Summary.  MyChart is used to connect with patients for Virtual Visits (Telemedicine).  Patients are able to view lab/test results, encounter notes, upcoming appointments, etc.  Non-urgent messages can be sent to your provider as well.   To learn more about what you can do with MyChart, go to [email or web address].    Your next appointment:   6 month(s)  Provider:   Norman Herrlich, MD    Other Instructions None

## 2023-09-15 ENCOUNTER — Other Ambulatory Visit (HOSPITAL_BASED_OUTPATIENT_CLINIC_OR_DEPARTMENT_OTHER): Payer: Self-pay | Admitting: Family Medicine

## 2023-09-15 DIAGNOSIS — R9389 Abnormal findings on diagnostic imaging of other specified body structures: Secondary | ICD-10-CM

## 2023-09-15 DIAGNOSIS — K760 Fatty (change of) liver, not elsewhere classified: Secondary | ICD-10-CM

## 2023-09-17 ENCOUNTER — Inpatient Hospital Stay (HOSPITAL_BASED_OUTPATIENT_CLINIC_OR_DEPARTMENT_OTHER): Admission: RE | Admit: 2023-09-17 | Payer: Self-pay | Source: Ambulatory Visit | Admitting: Radiology

## 2023-09-24 ENCOUNTER — Other Ambulatory Visit (HOSPITAL_BASED_OUTPATIENT_CLINIC_OR_DEPARTMENT_OTHER): Payer: Self-pay | Admitting: Radiology

## 2023-09-29 ENCOUNTER — Ambulatory Visit (HOSPITAL_BASED_OUTPATIENT_CLINIC_OR_DEPARTMENT_OTHER)
Admission: RE | Admit: 2023-09-29 | Discharge: 2023-09-29 | Disposition: A | Discharge status: Home / Self Care | Payer: Self-pay | Source: Ambulatory Visit | Attending: Family Medicine | Admitting: Family Medicine

## 2023-09-29 DIAGNOSIS — K760 Fatty (change of) liver, not elsewhere classified: Secondary | ICD-10-CM

## 2023-09-29 DIAGNOSIS — R9389 Abnormal findings on diagnostic imaging of other specified body structures: Secondary | ICD-10-CM

## 2023-10-22 ENCOUNTER — Other Ambulatory Visit: Payer: Self-pay | Admitting: Cardiology

## 2023-11-03 ENCOUNTER — Encounter: Payer: Self-pay | Admitting: Gastroenterology

## 2023-12-28 ENCOUNTER — Ambulatory Visit: Payer: Self-pay | Admitting: Gastroenterology

## 2023-12-28 NOTE — Progress Notes (Deleted)
 Chief Complaint:cirrhosis Primary GI Doctor:***  HPI:  Patient is a  53  year old male patient with past medical history of hypertension, GERD, anxiety, *****who was referred to me by [name], MD on 10/12/23 for a complaint of cirrhosis .    08/18/23 last seen by cardiology, reviewed note.  Interval History  Patient admits/denies GERD Patient admits/denies dysphagia Patient admits/denies nausea, vomiting, or weight loss  Patient admits/denies altered bowel habits Patient admits/denies abdominal pain Patient admits/denies rectal bleeding   Denies/Admits alcohol Denies/Admits smoking Denies/Admits NSAID use. Denies/Admits they are on blood thinners.  Patients last colonoscopy Patients last EGD  Patient's family history includes  Wt Readings from Last 3 Encounters:  08/18/23 219 lb 12.8 oz (99.7 kg)  07/07/23 221 lb 3.2 oz (100.3 kg)  06/09/23 224 lb 6.4 oz (101.8 kg)      Past Medical History:  Diagnosis Date   Acute coronary syndrome (HCC) 11/09/2019   Anxiety    Chest pain 11/09/2019   GERD (gastroesophageal reflux disease)    Hyperlipidemia    Hypertension    Hypertension, essential 11/09/2019   Tobacco abuse 11/09/2019    Past Surgical History:  Procedure Laterality Date   KNEE ARTHROSCOPY Right     Current Outpatient Medications  Medication Sig Dispense Refill   aspirin  EC 81 MG tablet Take 1 tablet (81 mg total) by mouth daily. Swallow whole. 90 tablet 3   atorvastatin  (LIPITOR) 40 MG tablet Take 40 mg by mouth daily.     carvedilol  (COREG ) 6.25 MG tablet Take 1 tablet (6.25 mg total) by mouth 2 (two) times daily. 180 tablet 3   Coenzyme Q10 (COQ10) 100 MG CAPS Take 100 mg by mouth daily.     Cyanocobalamin (B-12) 1000 MCG CAPS Take 1,000 mcg by mouth daily.     ezetimibe  (ZETIA ) 10 MG tablet Take 1 tablet (10 mg total) by mouth daily. 90 tablet 3   loratadine (CLARITIN) 10 MG tablet Take 10 mg by mouth daily.     pantoprazole (PROTONIX) 40 MG  tablet Take 40 mg by mouth daily.     telmisartan -hydrochlorothiazide (MICARDIS  HCT) 80-12.5 MG tablet Take 1 tablet by mouth daily. 90 tablet 3   No current facility-administered medications for this visit.    Allergies as of 12/28/2023   (No Known Allergies)    Family History  Problem Relation Age of Onset   Hypertension Mother    CAD Father        CABG   Hypertension Father     Review of Systems:    Constitutional: No weight loss, fever, chills, weakness or fatigue HEENT: Eyes: No change in vision               Ears, Nose, Throat:  No change in hearing or congestion Skin: No rash or itching Cardiovascular: No chest pain, chest pressure or palpitations   Respiratory: No SOB or cough Gastrointestinal: See HPI and otherwise negative Genitourinary: No dysuria or change in urinary frequency Neurological: No headache, dizziness or syncope Musculoskeletal: No new muscle or joint pain Hematologic: No bleeding or bruising Psychiatric: No history of depression or anxiety    Physical Exam:  Vital signs: There were no vitals taken for this visit.  Constitutional:   Pleasant *** male appears to be in NAD, Well developed, Well nourished, alert and cooperative Head:  Normocephalic and atraumatic. Eyes:   PEERL, EOMI. No icterus. Conjunctiva pink. Ears:  Normal auditory acuity. Neck:  Supple Throat: Oral cavity and  pharynx without inflammation, swelling or lesion.  Respiratory: Respirations even and unlabored. Lungs clear to auscultation bilaterally.   No wheezes, crackles, or rhonchi.  Cardiovascular: Normal S1, S2. Regular rate and rhythm. No peripheral edema, cyanosis or pallor.  Gastrointestinal:  Soft, nondistended, nontender. No rebound or guarding. Normal bowel sounds. No appreciable masses or hepatomegaly. Rectal:  Not performed.  Anoscopy: Msk:  Symmetrical without gross deformities. Without edema, no deformity or joint abnormality.  Neurologic:  Alert and  oriented x4;   grossly normal neurologically.  Skin:   Dry and intact without significant lesions or rashes. Psychiatric: Oriented to person, place and time. Demonstrates good judgement and reason without abnormal affect or behaviors.  RELEVANT LABS AND IMAGING: CBC     No data to display           CMP     Latest Ref Rng & Units 05/22/2023    9:31 AM 08/19/2022    9:41 AM 09/16/2021    8:34 AM  CMP  Glucose 70 - 99 mg/dL 99  895  884   BUN 6 - 24 mg/dL 15  19  16    Creatinine 0.76 - 1.27 mg/dL 9.17  9.28  9.19   Sodium 134 - 144 mmol/L 140  140  142   Potassium 3.5 - 5.2 mmol/L 3.8  4.0  3.9   Chloride 96 - 106 mmol/L 102  101  105   CO2 20 - 29 mmol/L 25  23  25    Calcium  8.7 - 10.2 mg/dL 9.5  9.1  9.2   Total Protein 6.0 - 8.5 g/dL 6.9  6.9    Total Bilirubin 0.0 - 1.2 mg/dL 0.7  0.5    Alkaline Phos 44 - 121 IU/L 114  102    AST 0 - 40 IU/L 28  21    ALT 0 - 44 IU/L 37  32     08/03/23 CT adrenal ABD WO contrast IMPRESSION: 1. No acute findings within the abdomen. 2. Heterogeneous attenuation of the liver with regional areas of fatty deposition identified within the posterior right hepatic lobe and anterior aspect of segment 2/3. Contour of the liver is slightly nodular. Squaring of the caudate lobe with widening along the falciform ligament. Findings are nonspecific but can be seen in the setting of cirrhosis. Correlate with liver function tests. 3. Fat containing umbilical hernia. 4.  Aortic Atherosclerosis (ICD10-I70.0). 09/29/23 US  ABD Limited RUQ IMPRESSION: 1. No acute abnormality identified. 2. Nodular contour with increased echotexture of the liver, nonspecific but can be seen in cirrhosis.  Assessment: 1. ***  Plan: -ANA, AMA, Anti-smooth muscle antibody, Hepatitis A IgG, IgM ( if acute infection suspected), Check first- Hepatitis B surface antigen, Hepatitis B surface antibody,  Second-Hepatitis B core ab , HCV antibody, ferritin, TIBC,  Alpha 1 antitrypsin,  ceruloplasmin, tTG, total IgA, PT/INR, IgG   Thank you for the courtesy of this consult. Please call me with any questions or concerns.   Khia Dieterich, FNP-C  Gastroenterology 12/28/2023, 6:48 AM  Cc: [name], MD

## 2024-02-19 ENCOUNTER — Ambulatory Visit: Payer: Self-pay | Admitting: Physician Assistant

## 2024-02-19 NOTE — Progress Notes (Deleted)
 02/19/2024 Dex Blakely Imperial Sr. [id number] 27-Mar-1971  Referring provider: [name], MD Primary GI doctor: {acdocs:27040}  ASSESSMENT AND PLAN:  Fatty liver versus early cirrhosis based on imaging CT adrenal abdomen January 2025 shows fatty liver versus early cirrhosis spleen was unremarkable RUQ US  March 2025 nodular contour increased echotexture of the liver 05/22/2023 AST 28 ALT 37 Alkphos 114 TBili 0.7 No recent CBC to review, reviewing liver function back to 2021 no elevated LFTs  Nonobstructive CAD Follows with Dr. Monetta last seen 08/18/2023  Patient Care Team: [name], MD as PCP - General (Family Medicine) Monetta Redell PARAS, MD as PCP - Cardiology (Cardiology)  HISTORY OF PRESENT ILLNESS: 53 y.o. male with medical history significant for nonobstructive CAD, tobacco use, hypertension, hyperlipidemia, aortic atherosclerosis presents for evaluation of fatty liver versus cirrhosis seen on CT adrenal abdomen without contrast 07/27/2023.  Spleen unremarkable.  Follow-up RUQ US  09/29/2023 shows nodular contour with increased echotexture of the liver nonspecific but can be seen with cirrhosis.  Previous CT adrenal abdomen 07/27/2023 spleen unremarkable.  No signs of portal hypertension but no recent CBC to evaluate platelets.   Last EGD was ***.  Last HCC screen: *** Last AFP No results found for requested labs within last 1095 days. No results found for requested labs within last 1095 days.  Last INR: No results found for requested labs within last 1095 days. No results found for requested labs within last 1095 days.   {cirrhosishepaticencephalopathy:26796} He {Denies/complains:31533} swelling.  He {Actions; are/are not:16769} on spironolactone and lasix.  Wt Readings from Last 3 Encounters:  08/18/23 219 lb 12.8 oz (99.7 kg)  07/07/23 221 lb 3.2 oz (100.3 kg)  06/09/23 224 lb 6.4 oz (101.8 kg)    Discussed the use of AI scribe software for clinical note  transcription with the patient, who gave verbal consent to proceed.  History of Present Illness            Hepatitis immunity status:  No results found for requested labs within last 1095 days. HepA No results found for requested labs within last 1095 days.  No results found for requested labs within last 1095 days. HepBsAG No results found for requested labs within last 1095 days.  No results found for requested labs within last 1095 days. HepAsAB No results found for requested labs within last 1095 days.  No results found for requested labs within last 1095 days. HepCAB No results found for requested labs within last 1095 days.   Social history:  He  reports that he quit smoking about 18 months ago. His smoking use included cigarettes. He has been exposed to tobacco smoke. He has never used smokeless tobacco. He reports current alcohol use of about 6.0 - 8.0 standard drinks of alcohol per week. He reports that he does not use drugs.  RELEVANT GI HISTORY, LABS, IMAGING:  CBC No results found for: WBC, RBC, HGB, HCT, PLT, MCV, MCH, MCHC, RDW, LYMPHSABS, MONOABS, EOSABS, BASOSABS No results for input(s): HGB in the last 8760 hours.  CMP     Component Value Date/Time   NA 140 05/22/2023 0931   K 3.8 05/22/2023 0931   CL 102 05/22/2023 0931   CO2 25 05/22/2023 0931   GLUCOSE 99 05/22/2023 0931   BUN 15 05/22/2023 0931   CREATININE 0.82 05/22/2023 0931   CALCIUM  9.5 05/22/2023 0931   PROT 6.9 05/22/2023 0931   ALBUMIN 4.4 05/22/2023 0931   AST 28 05/22/2023 0931   ALT 37  05/22/2023 0931   ALKPHOS 114 05/22/2023 0931   BILITOT 0.7 05/22/2023 0931   GFRNONAA 108 05/18/2020 0959   GFRAA 125 05/18/2020 0959      Latest Ref Rng & Units 05/22/2023    9:31 AM 08/19/2022    9:41 AM 04/03/2021    9:50 AM  Hepatic Function  Total Protein 6.0 - 8.5 g/dL 6.9  6.9  6.6   Albumin 3.8 - 4.9 g/dL 4.4  4.6  4.5   AST 0 - 40 IU/L 28  21  23    ALT 0 - 44 IU/L 37   32  37   Alk Phosphatase 44 - 121 IU/L 114  102  108   Total Bilirubin 0.0 - 1.2 mg/dL 0.7  0.5  0.5       Current Medications:    Current Outpatient Medications (Cardiovascular):    atorvastatin  (LIPITOR) 40 MG tablet, Take 40 mg by mouth daily.   carvedilol  (COREG ) 6.25 MG tablet, Take 1 tablet (6.25 mg total) by mouth 2 (two) times daily.   ezetimibe  (ZETIA ) 10 MG tablet, Take 1 tablet (10 mg total) by mouth daily.   telmisartan -hydrochlorothiazide (MICARDIS  HCT) 80-12.5 MG tablet, Take 1 tablet by mouth daily.  Current Outpatient Medications (Respiratory):    loratadine (CLARITIN) 10 MG tablet, Take 10 mg by mouth daily.  Current Outpatient Medications (Analgesics):    aspirin  EC 81 MG tablet, Take 1 tablet (81 mg total) by mouth daily. Swallow whole.  Current Outpatient Medications (Hematological):    Cyanocobalamin (B-12) 1000 MCG CAPS, Take 1,000 mcg by mouth daily.  Current Outpatient Medications (Other):    Coenzyme Q10 (COQ10) 100 MG CAPS, Take 100 mg by mouth daily.   pantoprazole (PROTONIX) 40 MG tablet, Take 40 mg by mouth daily.  Medical History:  Past Medical History:  Diagnosis Date   Acute coronary syndrome (HCC) 11/09/2019   Anxiety    Chest pain 11/09/2019   GERD (gastroesophageal reflux disease)    Hyperlipidemia    Hypertension    Hypertension, essential 11/09/2019   Tobacco abuse 11/09/2019   Allergies: No Known Allergies   Surgical History:  He  has a past surgical history that includes Knee arthroscopy (Right). Family History:  His family history includes CAD in his father; Hypertension in his father and mother.  REVIEW OF SYSTEMS  : All other systems reviewed and negative except where noted in the History of Present Illness.  PHYSICAL EXAM: There were no vitals taken for this visit. General :  Alert, well developed male in no acute distress Head:  Normocephalic and atraumatic. Eyes :  {sclerae:26738},conjunctive {conjuctiva:26739}  Heart:   {HEART EXAM HEM/ONC:21750} Pulm:  Clear anteriorly; no wheezing Abdomen:   {BlankSingle:19197::Distended,Ridged,Soft}, {BlankSingle:19197::Flat,Obese} AB, skin exam {ABDOMEN SKIN EXAM:22649}, {BlankSingle:19197::Absent,Hyperactive, tinkling,Hypoactive,Sluggish,Normal} bowel sounds. {Desc; pc desc - abdomen tenderness:5168} tenderness {anatomy; site abdomen:5010}. {BlankMultiple:19196::Without guarding,With guarding,Without rebound,With rebound}, {Exam; abdomen organomegaly:15152}. {No plus wild card:[phone]}  fluid wave, {No plus wild card:[phone]}  shifting dullness.  Extremities:   {With/Without:304960234} edema. Msk:  Symmetrical without gross deformities. Peripheral pulses intact.  Neurologic: Alert and  oriented x4;  grossly normal neurologically. {With-without:32421} asterixis or clonus.  Skin:   {With-without:32421} jaundice. {No plus wild card:[phone]} palmar erythema or spider angioma.   Psychiatric:  Demonstrates good judgement and reason without abnormal affect or behaviors.    Alan JONELLE Coombs, PA-C 7:40 AM

## 2024-02-25 NOTE — Progress Notes (Unsigned)
 Cardiology Office Note:    Date:  02/26/2024   ID:  Benjamin Lin Huq Sr., DOB January 21, 1971, MRN [id number]  PCP:  Benjamin Glisson, MD   [facility] Providers Cardiologist:  Benjamin Leiter, MD Cardiology APP:  Benjamin Delon BROCKS, NP     Referring MD: Benjamin Glisson, MD     History of Present Illness:    Benjamin Wehner Victoria Sr. is a 53 y.o. male with a hx of mild nonobstructive CAD, GERD, HTN, tobacco abuse, HLD, RBBB.   07/27/2023 renal artery duplex, negative for stenosis Coronary CTA on 09/29/2019  calcium  score of 5, 67th percentile, non-obstructive CAD   He established care with Dr. Leiter in 2021 for evaluation fo chest pain. He underwent a coronary CTA revealing mild non-obstructive CAD.  He was evaluated at [facility] emergency department on 05/21/2023 with concerns of high blood pressure, he had apparently not been taken his blood pressure medicine on a consistent basis but over the last 3 days he had been compliant.  Was 180/93 in the emergency department, potassium was 3.2, sodium 138, creatinine 0.60, troponins were negative, CBC was normal, EKG revealed normal sinus rhythm.   Most recently evaluated by Dr. Leiter on 08/18/2023 for management of his hypertension, he had undergone secondary causes for hypertension, which were normal, BP was elevated in the office but was better controlled at home, plans to follow up in 6 months.   He presents today accompanied by his wife for follow up of his hypertension. His cuff has been validated, he clearly has white coat hypertension-- BP log at home reviewed and it is well controlled at home. He walks most days for exercise. He denies chest pain, palpitations, dyspnea, pnd, orthopnea, n, v, dizziness, syncope, edema, weight gain, or early satiety.   Past Medical History:  Diagnosis Date   Acute coronary syndrome (HCC) 11/09/2019   Anxiety    Chest pain 11/09/2019   GERD (gastroesophageal reflux disease)    Hyperlipidemia     Hypertension    Hypertension, essential 11/09/2019   Tobacco abuse 11/09/2019    Past Surgical History:  Procedure Laterality Date   KNEE ARTHROSCOPY Right     Current Medications: Current Meds  Medication Sig   amLODipine (NORVASC) 2.5 MG tablet Take 2.5 mg by mouth daily.   aspirin  EC 81 MG tablet Take 1 tablet (81 mg total) by mouth daily. Swallow whole.   atorvastatin  (LIPITOR) 40 MG tablet Take 40 mg by mouth daily.   Coenzyme Q10 (COQ10) 100 MG CAPS Take 100 mg by mouth daily.   Cyanocobalamin (B-12) 1000 MCG CAPS Take 1,000 mcg by mouth daily.   ezetimibe  (ZETIA ) 10 MG tablet Take 1 tablet (10 mg total) by mouth daily.   loratadine (CLARITIN) 10 MG tablet Take 10 mg by mouth daily.   pantoprazole (PROTONIX) 40 MG tablet Take 40 mg by mouth daily.   tadalafil  (CIALIS ) 5 MG tablet Take 1 tablet (5 mg total) by mouth daily as needed for erectile dysfunction.   telmisartan -hydrochlorothiazide (MICARDIS  HCT) 80-12.5 MG tablet Take 1 tablet by mouth daily.     Allergies:   Patient has no known allergies.   Social History   Socioeconomic History   Marital status: Married    Spouse name: Not on file   Number of children: Not on file   Years of education: Not on file   Highest education level: Not on file  Occupational History   Not on file  Tobacco Use   Smoking  status: Former    Current packs/day: 0.00    Types: Cigarettes    Quit date: 08/2022    Years since quitting: 1.5    Passive exposure: Current   Smokeless tobacco: Never  Vaping Use   Vaping status: Never Used  Substance and Sexual Activity   Alcohol use: Yes    Alcohol/week: 6.0 - 8.0 standard drinks of alcohol    Types: 6 - 8 Cans of beer per week   Drug use: Never   Sexual activity: Not on file  Other Topics Concern   Not on file  Social History Narrative   Not on file   Social Drivers of Health   Financial Resource Strain: Not on file  Food Insecurity: Not on file  Transportation Needs: Not on  file  Physical Activity: Not on file  Stress: Not on file  Social Connections: Not on file     Family History: The patient's family history includes CAD in his father; Hypertension in his father and mother.  ROS:   Please see the history of present illness.     All other systems reviewed and are negative.  EKGs/Labs/Other Studies Reviewed:    The following studies were reviewed today: Cardiac Studies & Procedures   ______________________________________________________________________________________________          CT SCANS  CT CORONARY MORPH W/CTA COR W/SCORE 10/27/2019  Addendum 10/28/2019  7:40 AM ADDENDUM REPORT: 10/28/2019 07:37  EXAM: OVER-READ INTERPRETATION  CT CHEST  The following report is an over-read performed by radiologist Dr. Toribio Cove [facility] on 10/28/2019. This over-read does not include interpretation of cardiac or coronary anatomy or pathology. The coronary calcium  scoring cardiac CT exam interpretation by the cardiologist is attached.  COMPARISON:  None.  FINDINGS: Within the visualized portions of the thorax there are no suspicious appearing pulmonary nodules or masses, there is no acute consolidative airspace disease, no pleural effusions, no pneumothorax and no lymphadenopathy. Visualized portions of the upper abdomen are unremarkable. There are no aggressive appearing lytic or blastic lesions noted in the visualized portions of the skeleton.  IMPRESSION: No significant incidental noncardiac findings are noted.   Electronically Signed By: Toribio Aye M.D. On: 10/28/2019 07:37  Narrative CLINICAL DATA:  Chest pain  EXAM: Cardiac CTA  MEDICATIONS: Sub lingual nitro.  4mg   TECHNIQUE: The patient was scanned on a Siemens Force 192 slice scanner. Gantry rotation speed was 250 msecs. Collimation was .6 mm. A 100 kV prospective scan was triggered in the ascending thoracic aorta at 140 HU's Full mA was  used between 35% and 75% of the R-R interval. Average HR during the scan was 55 bpm. The 3D data set was interpreted on a dedicated work station using MPR, MIP and VRT modes. A total of 80 cc of contrast was used.  FINDINGS: Non-cardiac: See separate report from [facility]. No significant findings on limited lung and soft tissue windows.  Calcium  Score: Mild punctate calcium  noted in proximal  LM/RCA/LAD  Coronary Arteries: Right dominant with no anomalies  LM: 1-24% calcific plaque at ostium  LAD: 1-24% mixed plaque in the mid LAD at septal take off  D1: Normal  D2: Normal  Circumflex: Normal  OM1: Normal  OM2: Normal  OM3: Normal  RCA: 1-24% calcified plaque in mid RCA  PDA: Normal  PLA: Norma  IMPRESSION: 1. Calcium  score 5 which is 53 th percentile for age and sex  2.  Normal aortic root diameter 3.1 cm  3.  CAD  RADS 1 non obstructive CAD see description above  Maude Emmer  Electronically Signed: By: Maude Emmer M.D. On: 10/27/2019 16:46     ______________________________________________________________________________________________       EKG:  EKG is  ordered today.  The ekg ordered today demonstrates SR, HR 65 bpm, consistent with prior EKG tracings.  Recent Labs: 05/22/2023: ALT 37; BUN 15; Creatinine, Ser 0.82; Potassium 3.8; Sodium 140  Recent Lipid Panel    Component Value Date/Time   CHOL 111 05/22/2023 0931   TRIG 103 05/22/2023 0931   HDL 30 (L) 05/22/2023 0931   CHOLHDL 3.7 05/22/2023 0931   LDLCALC 61 05/22/2023 0931     Risk Assessment/Calculations:                Physical Exam:    VS:  BP 127/60   Pulse 80   Ht 5' 10 (1.778 m)   Wt 216 lb 12.8 oz (98.3 kg)   SpO2 98%   BMI 31.11 kg/m     Wt Readings from Last 3 Encounters:  02/26/24 216 lb 12.8 oz (98.3 kg)  08/18/23 219 lb 12.8 oz (99.7 kg)  07/07/23 221 lb 3.2 oz (100.3 kg)     GEN:  Well nourished, well developed in no acute distress HEENT:  Normal NECK: No JVD; No carotid bruits LYMPHATICS: No lymphadenopathy CARDIAC: RRR, no murmurs, rubs, gallops RESPIRATORY:  Clear to auscultation without rales, wheezing or rhonchi  ABDOMEN: Soft, non-tender, non-distended MUSCULOSKELETAL:  No edema; No deformity  SKIN: Warm and dry NEUROLOGIC:  Alert and oriented x 3 PSYCHIATRIC:  Normal affect   ASSESSMENT:    1. Hypertension, essential   2. Coronary artery disease involving native coronary artery of native heart with angina pectoris (HCC)   3. Mixed hyperlipidemia   4. Abnormal ultrasound of liver      PLAN:    In order of problems listed above:  CAD-mild nonobstructive CAD per coronary CTA in 2021. Stable with no anginal symptoms. No indication for ischemic evaluation.  Continue aspirin  81 mg daily, continue Lipitor 40 mg daily, continue Zetia  10 mg daily. Heart healthy diet and regular cardiovascular exercise encouraged.    Hypertension-BP today after sitting a while was well controlled at 127/60, continue Micardis  80-12.5 mg daily, Norvasc 2.5 mg daily.   Anxiety surrounding health- persistent.   Hyperlipidemia--most recent LDL is well-controlled at 62, continue Lipitor 40 mg daily, continue Zetia  10 mg daily-as she cannot tolerate a higher dose of Lipitor secondary to myalgias.  GERD-continue Protonix 40 mg daily, will refer to GI, he has persistent belching for ~ 1 year, along with abnormal US  of his liver.    Tobacco use-he formally stopped, vapes on a rare occasion, he may smoke a few cigarettes on the weekend when he is drinking.   ED - on Cialis , will continue.   Disposition- Ambulatory referral to Dr. Larene, follow up with Dr. Monetta only in 6 months.         Medication Adjustments/Labs and Tests Ordered: Current medicines are reviewed at length with the patient today.  Concerns regarding medicines are outlined above.  Orders Placed This Encounter  Procedures   Ambulatory referral to Gastroenterology    Meds ordered this encounter  Medications   tadalafil  (CIALIS ) 5 MG tablet    Sig: Take 1 tablet (5 mg total) by mouth daily as needed for erectile dysfunction.    Dispense:  90 tablet    Refill:  3    Supervising Provider:  BERNIE LAMAR PARAS [[id number]]    Patient Instructions  Medication Instructions:  Your physician recommends that you continue on your current medications as directed. Please refer to the Current Medication list given to you today.  *If you need a refill on your cardiac medications before your next appointment, please call your pharmacy*   Lab Work: None ordered If you have labs (blood work) drawn today and your tests are completely normal, you will receive your results only by: MyChart Message (if you have MyChart) OR A paper copy in the mail If you have any lab test that is abnormal or we need to change your treatment, we will call you to review the results.   Testing/Procedures: None ordered   Follow-Up: At [facility], you and your health needs are our priority.  As part of our continuing mission to provide you with exceptional heart care, we have created designated Provider Care Teams.  These Care Teams include your primary Cardiologist (physician) and Advanced Practice Providers (APPs -  Physician Assistants and Nurse Practitioners) who all work together to provide you with the care you need, when you need it.  We recommend signing up for the patient portal called MyChart.  Sign up information is provided on this After Visit Summary.  MyChart is used to connect with patients for Virtual Visits (Telemedicine).  Patients are able to view lab/test results, encounter notes, upcoming appointments, etc.  Non-urgent messages can be sent to your provider as well.   To learn more about what you can do with MyChart, go to [email or web address].    Your next appointment:   6 month(s)  The format for your next appointment:   In Person  Provider:    [name], MD    Other Instructions none  Important Information About Sugar        Signed, Delon JAYSON Hoover, NP  02/26/2024 8:45 AM    [facility]

## 2024-02-26 ENCOUNTER — Ambulatory Visit: Payer: Self-pay | Attending: Cardiology | Admitting: Cardiology

## 2024-02-26 ENCOUNTER — Encounter: Payer: Self-pay | Admitting: Cardiology

## 2024-02-26 VITALS — BP 127/60 | HR 80 | Ht 70.0 in | Wt 216.8 lb

## 2024-02-26 DIAGNOSIS — I25119 Atherosclerotic heart disease of native coronary artery with unspecified angina pectoris: Secondary | ICD-10-CM

## 2024-02-26 DIAGNOSIS — N529 Male erectile dysfunction, unspecified: Secondary | ICD-10-CM

## 2024-02-26 DIAGNOSIS — R4589 Other symptoms and signs involving emotional state: Secondary | ICD-10-CM

## 2024-02-26 DIAGNOSIS — R932 Abnormal findings on diagnostic imaging of liver and biliary tract: Secondary | ICD-10-CM

## 2024-02-26 DIAGNOSIS — E782 Mixed hyperlipidemia: Secondary | ICD-10-CM

## 2024-02-26 DIAGNOSIS — I10 Essential (primary) hypertension: Secondary | ICD-10-CM

## 2024-02-26 MED ORDER — TADALAFIL 5 MG PO TABS: 5.0000 mg | ORAL_TABLET | Freq: Every day | ORAL | 3 refills | Status: AC | PRN

## 2024-02-26 NOTE — Patient Instructions (Signed)

## 2024-04-12 NOTE — Progress Notes (Unsigned)
 04/13/2024 Benjamin Higginson Vinton Sr. [id number] 1971/06/11  Referring provider: [name], MD Primary GI doctor: Dr. Suzann  ASSESSMENT AND PLAN:  Abnormal CT and ultrasound of liver possible cirrhosis/nodular liver AST 28 ALT 37 Alkphos 114 TBili 0.7 No family history of liver issues History of ETOH but no significant use, no history of blood transfusion, drug use No evidence of portal hypertension Must exclude other chronic causes of hepatocellular inflammation that can mimic fatty liver on ultrasound    Latest Ref Rng & Units 05/22/2023    9:31 AM 08/19/2022    9:41 AM 04/03/2021    9:50 AM  Hepatic Function  Total Protein 6.0 - 8.5 g/dL 6.9  6.9  6.6   Albumin 3.8 - 4.9 g/dL 4.4  4.6  4.5   AST 0 - 40 IU/L 28  21  23    ALT 0 - 44 IU/L 37  32  37   Alk Phosphatase 44 - 121 IU/L 114  102  108   Total Bilirubin 0.0 - 1.2 mg/dL 0.7  0.5  0.5   Abnormal liver imaging shows nodular contour and fatty deposits. Normal liver function tests. Suspect fatty liver disease with potential fibrosis or cirrhosis. Plan to evaluate liver stiffness and function. - Order elastography for liver stiffness. - Order INR for liver synthetic function. - Advise low sodium diet and weight loss. - Advise against alcohol consumption. - Hepatitis panel, will hold off on hepatocellular work up with normal LFTS pending elastography - need LFTs and CBC monitored every 6 months, -Continue to work on risk factor modification including diet exercise and control of risk factors including blood sugars. - Counseled on ETOH cessation.   Nonobstructive CAD CTA 09/2019 calcium  score 5 follows with Dr. 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  -Recommend discontinuation of gum chewing, smoking,  drinking carbonated beverages, and gulping foods and liquids.  -If any underlying anxiety or depression will reach out to PCP to consider starting treatment.  - consider baclofen 10 mg TID if not helping.  - Diaphragmatic breathing exercises discussed Return to clinic in 3 months   Loose stools x 3-4 weeks, denies blood, mucus, AB pain. Has some bloating No fever, chills, weight loss, sick contants Nocturnal symptoms in the beginning No family history of colon cancer, possible MGF but uncertain Has not had screening for colon cancer - check for infection with Diatherix - possible post infectious IBS, given information - schedule for colonoscopy to evaluate, We have discussed the risks of bleeding, infection, perforation, medication reactions, and remote risk of death associated with colonoscopy. All questions were answered and the patient acknowledges these risk and wishes to proceed.  I have reviewed the clinic note as outlined by Benjamin Coombs, PA and agree with the assessment, plan and medical decision making.  Benjamin Douglas is referred to the office for an incidental finding of nodular liver contour on CT scan and right upper quadrant ultrasound suspicious for cirrhosis.  Denies any known history of chronic liver disease.  No family history of liver disease.  No significant alcohol use.  LFTs are normal.  Agree with elastography and hepatitis panel.  Pending those results can make a determination if there is a need to evaluate for other chronic hepatitides.  Also endorses symptoms of GERD, belching and loose stool.  Reasonable  to evaluate for H. pylori and schedule for screening colonoscopy.Benjamin Inocente Hausen, MD    Patient Care Team: Benjamin Glisson, MD as PCP - General (Family Medicine) Benjamin Redell PARAS, MD as PCP - Cardiology (Cardiology) Benjamin Delon BROCKS, NP as Nurse Practitioner (Cardiology)  HISTORY OF PRESENT ILLNESS: 53 y.o. male with medical history significant for hypertension,  CAD, hyperlipidemia, tobacco use, GERD presents for evaluation for possible cirrhosis.  Seen on CT adrenal abdomen without contrast 07/27/2023 slightly nodular, small cyst gallbladder decompressed normal pancreas and unremarkable spleen.  09/29/2023 RUQ US  nodular contour increased echotexture of the liver nonspecific can be seen with cirrhosis  Wt Readings from Last 3 Encounters:  04/13/24 222 lb (100.7 kg)  02/26/24 216 lb 12.8 oz (98.3 kg)  08/18/23 219 lb 12.8 oz (99.7 kg)   Discussed the use of AI scribe software for clinical note transcription with the patient, who gave verbal consent to proceed.  History of Present Illness   Benjamin Bebout Matson Sr. is a 53 year old male who presents with diarrhea and concerns about liver health. He was referred by his family doctor for evaluation of potential fatty liver disease.  He has been experiencing diarrhea or very soft stools for the past three to four weeks, with no blood in the stool. The onset felt like a stomach bug, accompanied by stomach pain and growling. Initially, the diarrhea improved but then returned. He has bowel movements three to four times during the day and sometimes at night, though this has improved over the past week. He suspects the symptoms might be related to something he ate. Despite the diarrhea, he generally has at least two bowel movements a day due to his high level of physical activity. The stools remain loose, even when formed.  He has a history of liver concerns, with a CT scan in January showing slight nodules on the liver and fatty deposits, and a right upper quadrant ultrasound in March showing a nodular contour to the liver. He denies any history of heavy alcohol use, stating he drinks beer occasionally, about once a week, and has no family history of liver issues. His blood work has consistently shown normal liver function.  He is on pantoprazole (Protonix) for reflux, which controls his symptoms well. No dark black  stools, trouble swallowing, or other gastrointestinal symptoms. He reports frequent burping, which began around the time he was informed about his liver condition.  He has not undergone any prior colon cancer screening, such as a colonoscopy or Cologuard test, and has no family history of colon cancer.      Social history:  He  reports that he quit smoking about 20 months ago. His smoking use included cigarettes. He has been exposed to tobacco smoke. He has never used smokeless tobacco. He reports current alcohol use of about 6.0 - 8.0 standard drinks of alcohol per week. He reports that he does not use drugs.  RELEVANT GI HISTORY, LABS, IMAGING:  CBC No results found for: WBC, RBC, HGB, HCT, PLT, MCV, MCH, MCHC, RDW, LYMPHSABS, MONOABS, EOSABS, BASOSABS No results for input(s): HGB in the last 8760 hours.  CMP     Component Value Date/Time   NA 140 05/22/2023 0931   K 3.8 05/22/2023 0931   CL 102 05/22/2023 0931   CO2 25 05/22/2023 0931   GLUCOSE 99 05/22/2023 0931   BUN 15 05/22/2023 0931   CREATININE 0.82 05/22/2023 0931   CALCIUM  9.5 05/22/2023 0931   PROT 6.9 05/22/2023  0931   ALBUMIN 4.4 05/22/2023 0931   AST 28 05/22/2023 0931   ALT 37 05/22/2023 0931   ALKPHOS 114 05/22/2023 0931   BILITOT 0.7 05/22/2023 0931   GFRNONAA 108 05/18/2020 0959   GFRAA 125 05/18/2020 0959      Latest Ref Rng & Units 05/22/2023    9:31 AM 08/19/2022    9:41 AM 04/03/2021    9:50 AM  Hepatic Function  Total Protein 6.0 - 8.5 g/dL 6.9  6.9  6.6   Albumin 3.8 - 4.9 g/dL 4.4  4.6  4.5   AST 0 - 40 IU/L 28  21  23    ALT 0 - 44 IU/L 37  32  37   Alk Phosphatase 44 - 121 IU/L 114  102  108   Total Bilirubin 0.0 - 1.2 mg/dL 0.7  0.5  0.5       Current Medications:    Current Outpatient Medications (Cardiovascular):    amLODipine (NORVASC) 2.5 MG tablet, Take 2.5 mg by mouth daily.   atorvastatin  (LIPITOR) 40 MG tablet, Take 40 mg by mouth daily.   ezetimibe   (ZETIA ) 10 MG tablet, Take 1 tablet (10 mg total) by mouth daily.   tadalafil  (CIALIS ) 5 MG tablet, Take 1 tablet (5 mg total) by mouth daily as needed for erectile dysfunction.   telmisartan -hydrochlorothiazide (MICARDIS  HCT) 80-12.5 MG tablet, Take 1 tablet by mouth daily.  Current Outpatient Medications (Respiratory):    loratadine (CLARITIN) 10 MG tablet, Take 10 mg by mouth daily.  Current Outpatient Medications (Analgesics):    aspirin  EC 81 MG tablet, Take 1 tablet (81 mg total) by mouth daily. Swallow whole.  Current Outpatient Medications (Hematological):    Cyanocobalamin (B-12) 1000 MCG CAPS, Take 1,000 mcg by mouth daily.  Current Outpatient Medications (Other):    Coenzyme Q10 (COQ10) 100 MG CAPS, Take 100 mg by mouth daily.   Na Sulfate-K Sulfate-Mg Sulfate concentrate (SUPREP) 17.5-3.13-1.6 GM/177ML SOLN, Take 1 kit (354 mLs total) by mouth once for 1 dose.   pantoprazole (PROTONIX) 40 MG tablet, Take 40 mg by mouth daily.  Medical History:  Past Medical History:  Diagnosis Date   Acute coronary syndrome (HCC) 11/09/2019   Anxiety    Chest pain 11/09/2019   GERD (gastroesophageal reflux disease)    Hyperlipidemia    Hypertension    Hypertension, essential 11/09/2019   Tobacco abuse 11/09/2019   Allergies: No Known Allergies   Surgical History:  He  has a past surgical history that includes Knee arthroscopy (Right). Family History:  His family history includes CAD in his father; Hypertension in his father and mother.  REVIEW OF SYSTEMS  : All other systems reviewed and negative except where noted in the History of Present Illness.  PHYSICAL EXAM: BP (!) 160/80   Pulse 96   Ht 5' 10 (1.778 m)   Wt 222 lb (100.7 kg)   BMI 31.85 kg/m  Physical Exam   VITALS: BP- 180/90 GENERAL APPEARANCE: Well nourished, in no apparent distress. HEENT: No cervical lymphadenopathy, unremarkable thyroid, sclerae anicteric, conjunctiva pink. RESPIRATORY: Respiratory effort normal,  breath sounds equal bilaterally without rales, rhonchi, or wheezing. CARDIO: Regular rate and rhythm with no murmurs, rubs, or gallops, peripheral pulses intact. ABDOMEN: Soft, non-distended, active bowel sounds in all four quadrants, no tenderness to palpation, no rebound, no mass appreciated. Liver not enlarged on palpation. Abdomen with gurgling, no pain. RECTAL: Declines. MUSCULOSKELETAL: Full range of motion, normal gait, without edema. SKIN: Dry, intact without rashes  or lesions. No jaundice. NEURO: Alert, oriented, no focal deficits. PSYCH: Cooperative, normal mood and affect.       Benjamin JONELLE Coombs, PA-C 11:11 AM

## 2024-04-13 ENCOUNTER — Ambulatory Visit: Payer: Self-pay | Admitting: Physician Assistant

## 2024-04-13 ENCOUNTER — Encounter: Payer: Self-pay | Admitting: Physician Assistant

## 2024-04-13 ENCOUNTER — Other Ambulatory Visit (INDEPENDENT_AMBULATORY_CARE_PROVIDER_SITE_OTHER): Payer: Self-pay

## 2024-04-13 ENCOUNTER — Ambulatory Visit (INDEPENDENT_AMBULATORY_CARE_PROVIDER_SITE_OTHER): Payer: Self-pay | Admitting: Physician Assistant

## 2024-04-13 VITALS — BP 160/80 | HR 96 | Ht 70.0 in | Wt 222.0 lb

## 2024-04-13 DIAGNOSIS — K219 Gastro-esophageal reflux disease without esophagitis: Secondary | ICD-10-CM

## 2024-04-13 DIAGNOSIS — R932 Abnormal findings on diagnostic imaging of liver and biliary tract: Secondary | ICD-10-CM

## 2024-04-13 DIAGNOSIS — R195 Other fecal abnormalities: Secondary | ICD-10-CM

## 2024-04-13 LAB — HEPATIC FUNCTION PANEL
ALT: 29 U/L (ref 0–53)
AST: 21 U/L (ref 0–37)
Albumin: 4.6 g/dL (ref 3.5–5.2)
Alkaline Phosphatase: 77 U/L (ref 39–117)
Bilirubin, Direct: 0.1 mg/dL (ref 0.0–0.3)
Total Bilirubin: 0.7 mg/dL (ref 0.2–1.2)
Total Protein: 7 g/dL (ref 6.0–8.3)

## 2024-04-13 LAB — GAMMA GT: GGT: 76 U/L — ABNORMAL HIGH (ref 7–51)

## 2024-04-13 LAB — PROTIME-INR
INR: 1.1 ratio — ABNORMAL HIGH (ref 0.8–1.0)
Prothrombin Time: 11.3 s (ref 9.6–13.1)

## 2024-04-13 MED ORDER — NA SULFATE-K SULFATE-MG SULF 17.5-3.13-1.6 GM/177ML PO SOLN
1.0000 | Freq: Once | ORAL | 0 refills | Status: AC
Start: 2024-04-13 — End: 2024-04-13

## 2024-04-13 NOTE — Patient Instructions (Addendum)
 Your provider has requested that you go to the basement level for lab work before leaving today. Press B on the elevator. The lab is located at the first door on the left as you exit the elevator.  Due to recent changes in healthcare laws, you may see the results of your imaging and laboratory studies on MyChart before your provider has had a chance to review them.  We understand that in some cases there may be results that are confusing or concerning to you. Not all laboratory results come back in the same time frame and the provider may be waiting for multiple results in order to interpret others.  Please give us  48 hours in order for your provider to thoroughly review all the results before contacting the office for clarification of your results.    You have been scheduled for an abdominal ultrasound with elastography at [facility] (1st floor). Your appointment is scheduled for Tuesday, 04/19/24 at 10:30 am. Please arrive 30 minutes prior to your scheduled appointment for registration purposes. Make certain not to have anything to eat or drink 6 hours prior to your procedure. Should you need to reschedule your appointment, you may contact radiology at [phone].  Liver Elastography Various chronic liver diseases such as hepatitis B, C, and fatty liver disease can lead to tissue damage and subsequent scar tissue formation. As the scar tissue accumulates, the liver loses some of its elasticity and becomes stiffer. Liver elastography involves the use of a surface ultrasound probe that delivers a low frequency pulse or shear wave to a small volume of liver tissue under the rib cage. The transmission of the sound wave is completely painless. How Is a Liver Elastography Performed? The liver is located in the right upper abdomen under the rib cage. Patients are asked to lie flat on an examination table. A technician places the FibroScan probe between the ribs on the right side of the lower  chest wall. A series of 10 painless pulses are then applied to the liver. The results are recorded on the equipment and an overall liver stiffness score is generated. This score is then interpreted by a qualified physician to predict the likelihood of advanced fibrosis or cirrhosis.  Patients are asked to wear loose clothing and should not consume any liquids or solids for a minimum of 4 hours before the test to increase the likelihood of obtaining reliable test results. The scan will take 10 to 15 minutes to complete, but patients should plan on being available for 30 minutes to allow time for preparation  ______________________________________________________________________________________ We are ordering a Diatherix stool testing for you to take home and complete.  You have received a kit from our office today containing all necessary supplies to complete this test.  Please carefully read the stool collection instructions provided in the kit before opening the accompanying materials  OR an easier way is to please use toilet paper to wipe after your bowel movement and use the qtip/applicator provided to get a small volume of the stool from the toilet paper and place that in the tube.   Important to remember: -Place the label onto the puritan opti-swab TUBE. -This label should include your full name and date of birth.  - This label should have the DATE AND TIME stool was collects -After completing the test, you should secure the tube into the specimen biohazard bag.  -The laboratory request information sheet (including date and time of specimen collection) should be placed into the  outside pocket of the specimen biohazard bag and returned to the [place] lab with 2 days of collection.   If it is greater than two days from collection you will be asked to repeat the test.  If the label is missing from the tube with your name, date of birth, date and time of collection on it, you will have to  repeat the test.  Any questions please message us  on my chart or call the office at [phone] ____________________________________________________________________________________________  Rosine have been scheduled for a colonoscopy. Please follow written instructions given to you at your visit today.   If you use inhalers (even only as needed), please bring them with you on the day of your procedure.  DO NOT TAKE 7 DAYS PRIOR TO TEST- Trulicity (dulaglutide) Ozempic, Wegovy (semaglutide) Mounjaro (tirzepatide) Bydureon Bcise (exanatide extended release)  DO NOT TAKE 1 DAY PRIOR TO YOUR TEST Rybelsus (semaglutide) Adlyxin (lixisenatide) Victoza (liraglutide) Byetta (exanatide) ___________________________________________________________________________    Rosine may have POST INFECTIOUS IBS OR IRRITABLE BOWEL After an infection or diverticulitis flare your intestines can spasm or be a little bit more sensitive. Try these things below:  Can do BRAT diet versus low FODMAP- see below Try trial off milk/lactose products.  Add fiber like benefiber or citracel once a day Can do trial of IBGard for AB pain EVERY DAY- Take 1-2 capsules once a day for maintence or twice a day during a flare Can take dicyclomine as needed.  if any worsening symptoms like blood in stool, weight loss, please call the office or go to the ER.    Thank you for trusting me with your gastrointestinal care!   Alan Coombs, PA-C     FODMAP stands for fermentable oligo-, di-, mono-saccharides and polyols (1). These are the scientific terms used to classify groups of carbs that are notorious for triggering digestive symptoms like bloating, gas and stomach pain.          Metabolic dysfunction associated seatohepatitis  Now the leading cause of liver failure in the [place] .  It is normally from such risk factors as obesity, diabetes, insulin resistance, high cholesterol, or metabolic syndrome.   The only definitive therapy is weight loss and exercise.   Suggest walking 20-30 mins daily.  Decreasing carbohydrates, increasing veggies.    Fatty Liver Fatty liver is the accumulation of fat in liver cells. It is also called hepatosteatosis or steatohepatitis. It is normal for your liver to contain some fat. If fat is more than 5 to 10% of your liver's weight, you have fatty liver.  There are often no symptoms (problems) for years while damage is still occurring. People often learn about their fatty liver when they have medical tests for other reasons. Fat can damage your liver for years or even decades without causing problems. When it becomes severe, it can cause fatigue, weight loss, weakness, and confusion. This makes you more likely to develop more serious liver problems. The liver is the largest organ in the body. It does a lot of work and often gives no warning signs when it is sick until late in a disease. The liver has many important jobs including: Breaking down foods. Storing vitamins, iron, and other minerals. Making proteins. Making bile for food digestion. Breaking down many products including medications, alcohol and some poisons.  PROGNOSIS  Fatty liver may cause no damage or it can lead to an inflammation of the liver. This is, called steatohepatitis.  Over time the liver may become  scarred and hardened. This condition is called cirrhosis. Cirrhosis is serious and may lead to liver failure or cancer. NASH is one of the leading causes of cirrhosis. About 10-20% of Americans have fatty liver and a smaller 2-5% has NASH.  TREATMENT  Weight loss, fat restriction, and exercise in overweight patients produces inconsistent results but is worth trying. Good control of diabetes may reduce fatty liver. Eat a balanced, healthy diet. Increase your physical activity. There are no medical or surgical treatments for a fatty liver or NASH, but improving your diet and increasing your  exercise may help prevent or reverse some of the damage.  Remember belching is caused by excessive air swallowing.   Please stop: Eating or drinking too fast  Poorly fitting dentures; not chewing food completely  Carbonated beverages  Chewing gum or sucking on hard candies  Excessive swallowing due to nervous tension or postnasal drip  Forced belching to relieve abdominal discomfort To prevent excessive belching, avoid:  Carbonated beverages  Chewing gum  Hard candies   Simethicone/GasX may be helpful  Can try Florastor probiotic twice a day   _______________________________________________________  If your blood pressure at your visit was 140/90 or greater, please contact your primary care physician to follow up on this.  _______________________________________________________  If you are age 9 or older, your body mass index should be between 23-30. Your Body mass index is 31.85 kg/m. If this is out of the aforementioned range listed, please consider follow up with your Primary Care Provider.  If you are age 53 or younger, your body mass index should be between 19-25. Your Body mass index is 31.85 kg/m. If this is out of the aformentioned range listed, please consider follow up with your Primary Care Provider.   ________________________________________________________  The [facility] providers would like to encourage you to use MYCHART to communicate with providers for non-urgent requests or questions.  Due to long hold times on the telephone, sending your provider a message by [facility] may be a faster and more efficient way to get a response.  Please allow 48 business hours for a response.  Please remember that this is for non-urgent requests.  _______________________________________________________  [facility] is using a team-based approach to care.  Your team is made up of your doctor and two to three APPS. Our APPS (Nurse Practitioners and Physician Assistants)  work with your physician to ensure care continuity for you. They are fully qualified to address your health concerns and develop a treatment plan. They communicate directly with your gastroenterologist to care for you. Seeing the Advanced Practice Practitioners on your physician's team can help you by facilitating care more promptly, often allowing for earlier appointments, access to diagnostic testing, procedures, and other specialty referrals.

## 2024-04-19 ENCOUNTER — Ambulatory Visit (HOSPITAL_COMMUNITY): Payer: Self-pay

## 2024-04-19 ENCOUNTER — Ambulatory Visit (INDEPENDENT_AMBULATORY_CARE_PROVIDER_SITE_OTHER)
Admission: RE | Admit: 2024-04-19 | Discharge: 2024-04-19 | Disposition: A | Discharge status: Home / Self Care | Payer: Self-pay | Source: Ambulatory Visit | Attending: Family Medicine | Admitting: Family Medicine

## 2024-04-19 DIAGNOSIS — R7989 Other specified abnormal findings of blood chemistry: Secondary | ICD-10-CM

## 2024-04-19 DIAGNOSIS — R195 Other fecal abnormalities: Secondary | ICD-10-CM

## 2024-04-19 DIAGNOSIS — K760 Fatty (change of) liver, not elsewhere classified: Secondary | ICD-10-CM

## 2024-04-19 DIAGNOSIS — R932 Abnormal findings on diagnostic imaging of liver and biliary tract: Secondary | ICD-10-CM

## 2024-04-19 DIAGNOSIS — K219 Gastro-esophageal reflux disease without esophagitis: Secondary | ICD-10-CM

## 2024-04-22 ENCOUNTER — Telehealth: Payer: Self-pay

## 2024-04-22 NOTE — Telephone Encounter (Signed)
 Need to repeat h pylori diatherix Received: 2 days ago Craig Palma R, PA-C  P Lbgi Pod B Triage

## 2024-04-28 ENCOUNTER — Other Ambulatory Visit: Payer: Self-pay

## 2024-04-28 DIAGNOSIS — R195 Other fecal abnormalities: Secondary | ICD-10-CM

## 2024-04-28 DIAGNOSIS — K219 Gastro-esophageal reflux disease without esophagitis: Secondary | ICD-10-CM

## 2024-04-28 NOTE — Telephone Encounter (Signed)
 Message sent to patient. Kit placed on 2nd floor front desk for pick up.

## 2024-05-02 ENCOUNTER — Encounter: Payer: Self-pay | Admitting: Physician Assistant

## 2024-05-02 NOTE — Progress Notes (Unsigned)
 [facility] History and Physical   Primary Care Physician:  [name], MD   Reason for Procedure:  Colorectal cancer screening; incidental notation of loose stool and abdominal pain  Plan:    Colonoscopy     HPI: Benjamin Ballon Mo Sr. is a 53 y.o. male undergoing colonoscopy for colorectal cancer screening with incidental notation of loose stool and abdominal pain.  Patient has never had a colonoscopy.  No family history of colorectal cancer or polyps.  At the time of recent clinic visit he reported having diarrhea or soft stools for 3 to 4 weeks without any melena or hematochezia.  Also endorsed some abdominal discomfort.   Past Medical History:  Diagnosis Date   Acute coronary syndrome (HCC) 11/09/2019   Anxiety    Chest pain 11/09/2019   GERD (gastroesophageal reflux disease)    Hyperlipidemia    Hypertension    Hypertension, essential 11/09/2019   Tobacco abuse 11/09/2019    Past Surgical History:  Procedure Laterality Date   KNEE ARTHROSCOPY Right     Prior to Admission medications   Medication Sig Start Date End Date Taking? Authorizing Provider  amLODipine (NORVASC) 2.5 MG tablet Take 2.5 mg by mouth daily. 02/10/24   [provider]  aspirin  EC 81 MG tablet Take 1 tablet (81 mg total) by mouth daily. Swallow whole. 04/03/21   Monetta Redell PARAS, MD  atorvastatin  (LIPITOR) 40 MG tablet Take 40 mg by mouth daily. 08/03/23   [provider]  Coenzyme Q10 (COQ10) 100 MG CAPS Take 100 mg by mouth daily.    [provider]  Cyanocobalamin (B-12) 1000 MCG CAPS Take 1,000 mcg by mouth daily.    [provider]  ezetimibe  (ZETIA ) 10 MG tablet Take 1 tablet (10 mg total) by mouth daily. 11/03/22   [name], NP  loratadine (CLARITIN) 10 MG tablet Take 10 mg by mouth daily.    [provider]  pantoprazole (PROTONIX) 40 MG tablet Take 40 mg by mouth daily. 05/18/23   [provider]  tadalafil  (CIALIS ) 5 MG  tablet Take 1 tablet (5 mg total) by mouth daily as needed for erectile dysfunction. 02/26/24   [name], NP  telmisartan -hydrochlorothiazide (MICARDIS  HCT) 80-12.5 MG tablet Take 1 tablet by mouth daily. 05/25/23   [name], NP    Current Outpatient Medications  Medication Sig Dispense Refill   amLODipine (NORVASC) 2.5 MG tablet Take 2.5 mg by mouth daily.     aspirin  EC 81 MG tablet Take 1 tablet (81 mg total) by mouth daily. Swallow whole. 90 tablet 3   atorvastatin  (LIPITOR) 40 MG tablet Take 40 mg by mouth daily.     Coenzyme Q10 (COQ10) 100 MG CAPS Take 100 mg by mouth daily.     Cyanocobalamin (B-12) 1000 MCG CAPS Take 1,000 mcg by mouth daily.     ezetimibe  (ZETIA ) 10 MG tablet Take 1 tablet (10 mg total) by mouth daily. 90 tablet 3   loratadine (CLARITIN) 10 MG tablet Take 10 mg by mouth daily.     pantoprazole (PROTONIX) 40 MG tablet Take 40 mg by mouth daily.     telmisartan -hydrochlorothiazide (MICARDIS  HCT) 80-12.5 MG tablet Take 1 tablet by mouth daily. 90 tablet 3   tadalafil  (CIALIS ) 5 MG tablet Take 1 tablet (5 mg total) by mouth daily as needed for erectile dysfunction. 90 tablet 3   Current Facility-Administered Medications  Medication Dose Route Frequency Provider Last Rate Last Admin   0.9 %  sodium chloride infusion  500 mL Intravenous Once Natanael Saladin M, MD        Allergies as of 05/04/2024   (No Known Allergies)    Family History  Problem Relation Age of Onset   Hypertension Mother    CAD Father        CABG   Hypertension Father     Social History   Socioeconomic History   Marital status: Married    Spouse name: Not on file   Number of children: Not on file   Years of education: Not on file   Highest education level: Not on file  Occupational History   Not on file  Tobacco Use   Smoking status: Former    Current packs/day: 0.00    Types: Cigarettes    Quit date: 08/2022    Years since quitting: 1.7    Passive exposure:  Current   Smokeless tobacco: Never  Vaping Use   Vaping status: Never Used  Substance and Sexual Activity   Alcohol use: Yes    Alcohol/week: 6.0 - 8.0 standard drinks of alcohol    Types: 6 - 8 Cans of beer per week   Drug use: Never   Sexual activity: Not on file  Other Topics Concern   Not on file  Social History Narrative   Not on file   Social Drivers of Health   Financial Resource Strain: Not on file  Food Insecurity: Not on file  Transportation Needs: Not on file  Physical Activity: Not on file  Stress: Not on file  Social Connections: Not on file  Intimate Partner Violence: Not on file    Review of Systems:  All other review of systems negative except as mentioned in the HPI.  Physical Exam: Vital signs BP (!) 140/75   Pulse 95   Temp 97.8 F (36.6 C)   Ht 5' 10 (1.778 m)   Wt 222 lb (100.7 kg)   SpO2 98%   BMI 31.85 kg/m   General:   Alert,  Well-developed, well-nourished, pleasant and cooperative in NAD Airway:  Mallampati 2 Lungs:  Clear throughout to auscultation.   Heart:  Regular rate and rhythm; no murmurs, clicks, rubs,  or gallops. Abdomen:  Soft, nontender and nondistended. Normal bowel sounds.   Neuro/Psych:  Normal mood and affect. A and O x 3  Inocente Hausen, MD [facility]

## 2024-05-04 ENCOUNTER — Ambulatory Visit: Payer: Self-pay | Admitting: Pediatrics

## 2024-05-04 ENCOUNTER — Other Ambulatory Visit: Payer: Self-pay

## 2024-05-04 ENCOUNTER — Encounter: Payer: Self-pay | Admitting: Pediatrics

## 2024-05-04 VITALS — BP 119/65 | HR 76 | Temp 97.8°F | Resp 15 | Ht 70.0 in | Wt 222.0 lb

## 2024-05-04 DIAGNOSIS — Z1211 Encounter for screening for malignant neoplasm of colon: Secondary | ICD-10-CM

## 2024-05-04 DIAGNOSIS — R197 Diarrhea, unspecified: Secondary | ICD-10-CM

## 2024-05-04 DIAGNOSIS — K5730 Diverticulosis of large intestine without perforation or abscess without bleeding: Secondary | ICD-10-CM

## 2024-05-04 DIAGNOSIS — K635 Polyp of colon: Secondary | ICD-10-CM

## 2024-05-04 DIAGNOSIS — D124 Benign neoplasm of descending colon: Secondary | ICD-10-CM

## 2024-05-04 DIAGNOSIS — D128 Benign neoplasm of rectum: Secondary | ICD-10-CM

## 2024-05-04 DIAGNOSIS — D125 Benign neoplasm of sigmoid colon: Secondary | ICD-10-CM

## 2024-05-04 DIAGNOSIS — K621 Rectal polyp: Secondary | ICD-10-CM

## 2024-05-04 MED ORDER — SODIUM CHLORIDE 0.9 % IV SOLN: 500.0000 mL | Freq: Once | INTRAVENOUS | Status: DC

## 2024-05-04 NOTE — Patient Instructions (Signed)

## 2024-05-04 NOTE — Progress Notes (Signed)
 Called to room to assist during endoscopic procedure.  Patient ID and intended procedure confirmed with present staff. Received instructions for my participation in the procedure from the performing physician.

## 2024-05-04 NOTE — Op Note (Signed)
 [facility] Patient Name: Benjamin Douglas Procedure Date: 05/04/2024 9:54 AM MRN: [id number] Endoscopist: Inocente Hausen , MD, [id number] Age: 53 Referring MD:  Date of Birth: Apr 22, 1971 Gender: Male Account #: [id number] Procedure:                Colonoscopy Indications:              Screening for colorectal malignant neoplasm, This                            is the patient's first colonoscopy; recent history                            of diarrhea at the time of clinic visit?"patient                            states symptoms resolved prior to colonoscopy Medicines:                None - per patient request Procedure:                Pre-Anesthesia Assessment:                           - Prior to the procedure, a History and Physical                            was performed, and patient medications and                            allergies were reviewed. The patient's tolerance of                            previous anesthesia was also reviewed. The risks                            and benefits of the procedure and the sedation                            options and risks were discussed with the patient.                            All questions were answered, and informed consent                            was obtained. Prior Anticoagulants: The patient has                            taken no anticoagulant or antiplatelet agents. ASA                            Grade Assessment: III - A patient with severe                            systemic disease. After reviewing the risks and  benefits, the patient was deemed in satisfactory                            condition to undergo the procedure.                           After obtaining informed consent, the colonoscope                            was passed under direct vision. Throughout the                            procedure, the patient's blood pressure, pulse, and                            oxygen  saturations were monitored continuously. The                            Olympus Scope SN: [id number] was introduced through                            the anus and advanced to the the cecum, identified                            by appendiceal orifice and ileocecal valve. The                            colonoscopy was performed without difficulty. The                            patient tolerated the procedure well. The quality                            of the bowel preparation was good. The ileocecal                            valve, appendiceal orifice, and rectum were                            photographed. Scope In: 10:00:26 AM Scope Out: 10:23:25 AM Scope Withdrawal Time: 0 hours 17 minutes 47 seconds  Total Procedure Duration: 0 hours 22 minutes 59 seconds  Findings:                 The perianal and digital rectal examinations were                            normal. Pertinent negatives include normal                            sphincter tone and no palpable rectal lesions.                           A few small-mouthed diverticula were found in the  sigmoid colon.                           Four sessile polyps were found in the rectum,                            sigmoid colon and descending colon. The polyps were                            3 to 4 mm in size. These polyps were removed with a                            cold biopsy forceps. Resection and retrieval were                            complete.                           Three sessile polyps were found in the sigmoid                            colon. The polyps were 4 to 6 mm in size. These                            polyps were removed with a cold snare. Resection                            and retrieval were complete.                           The retroflexed view of the distal rectum and anal                            verge was normal and showed no anal or rectal                             abnormalities. Complications:            No immediate complications. Estimated blood loss:                            Minimal. Estimated Blood Loss:     Estimated blood loss was minimal. Impression:               - Diverticulosis in the sigmoid colon.                           - Four 3 to 4 mm polyps in the rectum, in the                            sigmoid colon and in the descending colon, removed                            with a cold biopsy forceps. Resected and retrieved.                           -  Three 4 to 6 mm polyps in the sigmoid colon,                            removed with a cold snare. Resected and retrieved.                           - The distal rectum and anal verge are normal on                            retroflexion view. Recommendation:           - Discharge patient to home (ambulatory).                           - Await pathology results.                           - Repeat colonoscopy for surveillance based on                            pathology results.                           - The findings and recommendations were discussed                            with the patient's family.                           - Return to referring physician.                           - Patient has a contact number available for                            emergencies. The signs and symptoms of potential                            delayed complications were discussed with the                            patient. Return to normal activities tomorrow.                            Written discharge instructions were provided to the                            patient. Inocente Hausen, MD 05/04/2024 10:30:02 AM This report has been signed electronically.

## 2024-05-04 NOTE — Progress Notes (Signed)
 Patient spoke with CRNA,Tim Blocker,concerning desire to try procedure without anesthesia.

## 2024-05-04 NOTE — Progress Notes (Signed)
 Sedate, gd SR, tolerated procedure well, VSS, report to RN

## 2024-05-05 ENCOUNTER — Telehealth: Payer: Self-pay

## 2024-05-05 ENCOUNTER — Other Ambulatory Visit: Payer: Self-pay | Admitting: Cardiology

## 2024-05-05 LAB — HEPATITIS B SURFACE ANTIGEN: Hepatitis B Surface Ag: NONREACTIVE

## 2024-05-05 LAB — HEPATITIS C ANTIBODY: Hepatitis C Ab: NONREACTIVE

## 2024-05-05 LAB — HEPATITIS A ANTIBODY, TOTAL: Hepatitis A AB,Total: NONREACTIVE

## 2024-05-05 NOTE — Telephone Encounter (Signed)
  Follow up Call-     05/04/2024    9:29 AM  Call back number  Post procedure Call Back phone  # [phone]  Permission to leave phone message Yes     Patient questions:  Do you have a fever, pain , or abdominal swelling? No. Pain Score  0 *  Have you tolerated food without any problems? Yes.    Have you been able to return to your normal activities? Yes.    Do you have any questions about your discharge instructions: Diet   No. Medications  No. Follow up visit  No.  Do you have questions or concerns about your Care? No.  Actions: * If pain score is 4 or above: No action needed, pain <4.

## 2024-05-06 LAB — SURGICAL PATHOLOGY

## 2024-05-08 ENCOUNTER — Ambulatory Visit: Payer: Self-pay | Admitting: Pediatrics

## 2024-07-19 ENCOUNTER — Telehealth: Payer: Self-pay | Admitting: Cardiology

## 2024-07-19 NOTE — Telephone Encounter (Signed)
" °*  STAT* If patient is at the pharmacy, call can be transferred to refill team.   1. Which medications need to be refilled? (please list name of each medication and dose if known) ezetimibe  (ZETIA ) 10 MG tablet    2. Would you like to learn more about the convenience, safety, & potential cost savings by using the [facility]?  3. Are you open to using the [facility] (Type [facility].    4. Which pharmacy/location (including street and city if local pharmacy) is medication to be sent to? [facility] - [place], [place] - [address]     5. Do they need a 30 day or 90 day supply? Refill until upcoming appt  09/07/24  Patient has one tablet left  "

## 2024-07-21 MED ORDER — EZETIMIBE 10 MG PO TABS: 10.0000 mg | ORAL_TABLET | Freq: Every day | ORAL | 0 refills | Status: AC

## 2024-07-21 NOTE — Telephone Encounter (Signed)
 Refill sent

## 2024-09-07 ENCOUNTER — Ambulatory Visit: Payer: Self-pay | Admitting: Cardiology
